# Patient Record
Sex: Male | Born: 1935 | Race: White | Hispanic: No | Smoking: Former smoker
Health system: Southern US, Community
[De-identification: ages and names within clinical notes are randomized; demographics above are authoritative.]

## PROBLEM LIST (undated history)

## (undated) DIAGNOSIS — I359 Nonrheumatic aortic valve disorder, unspecified: Secondary | ICD-10-CM

## (undated) DIAGNOSIS — Z860100 Personal history of colon polyps, unspecified: Secondary | ICD-10-CM

## (undated) DIAGNOSIS — I10 Essential (primary) hypertension: Secondary | ICD-10-CM

## (undated) DIAGNOSIS — I4891 Unspecified atrial fibrillation: Secondary | ICD-10-CM

## (undated) DIAGNOSIS — R413 Other amnesia: Secondary | ICD-10-CM

## (undated) DIAGNOSIS — C801 Malignant (primary) neoplasm, unspecified: Secondary | ICD-10-CM

## (undated) DIAGNOSIS — I6529 Occlusion and stenosis of unspecified carotid artery: Secondary | ICD-10-CM

## (undated) DIAGNOSIS — E119 Type 2 diabetes mellitus without complications: Secondary | ICD-10-CM

## (undated) DIAGNOSIS — G459 Transient cerebral ischemic attack, unspecified: Secondary | ICD-10-CM

## (undated) DIAGNOSIS — I519 Heart disease, unspecified: Secondary | ICD-10-CM

## (undated) DIAGNOSIS — F0390 Unspecified dementia without behavioral disturbance: Secondary | ICD-10-CM

## (undated) DIAGNOSIS — E785 Hyperlipidemia, unspecified: Secondary | ICD-10-CM

## (undated) DIAGNOSIS — Z970 Presence of artificial eye: Secondary | ICD-10-CM

## (undated) HISTORY — PX: CATARACT EXTRACTION: SUR2

---

## 2011-11-20 ENCOUNTER — Ambulatory Visit: Payer: Self-pay | Admitting: Internal Medicine

## 2014-05-28 ENCOUNTER — Inpatient Hospital Stay: Payer: Self-pay | Admitting: Internal Medicine

## 2014-08-09 NOTE — Consult Note (Signed)
PATIENT NAME:  John Werner, John Werner MR#:  [id number] DATE OF BIRTH:  05-01-1921  DATE OF CONSULTATION:  05/28/2014  CONSULTING PHYSICIAN:  Laurene Footman, MD  REASON FOR CONSULTATION:  Left ring finger infection.   HISTORY OF PRESENT ILLNESS: The patient is a 81 year old.  The daughter is concerned that he may have had a cat bite. He suffers from some dementia and is in an assisted care apartment. He had swelling to the finger and his daughter reports it has gotten larger during the day, the 18th, with increased pain and swelling. The patient was examined in the Emergency Room and it was clear he had an abscess. The depth of the abscess was unclear. This was opened in the Emergency Room and there is large amount of purulent material. The following day after IV antibiotics his erythema was lessened in the dorsum of the hand and he is able to move the finger with much less discomfort. Dr. Ola Spurr has been consulted for antibiotic choice.   IMPRESSION:  Abscess to the left ring finger with associated cellulitis.   PLAN:  He seems to be improving.  I will see him in 2 weeks and determine if he requires any hand therapy at that time. His skin should granulate in and heal well as long as the infection does not recur.      ____________________________ Laurene Footman, MD mjm:at D: 05/29/2014 19:06:00 ET T: 05/29/2014 19:35:03 ET JOB#: [id number]  cc: Laurene Footman, MD, <Dictator> Laurene Footman MD ELECTRONICALLY SIGNED 05/30/2014 7:26

## 2014-08-09 NOTE — Op Note (Signed)
PATIENT NAME:  John Werner, John Werner MR#:  [id number] DATE OF BIRTH:  1921-10-12  DATE OF PROCEDURE:  05/28/2014  PREOPERATIVE DIAGNOSIS: Abscess, left ring finger.   POSTOPERATIVE DIAGNOSIS: Abscess, left ring finger.  PROCEDURE: Incision and drainage of left ring finger.   ANESTHESIA: None.   LOCATION: Procedure done in Emergency Room.   DESCRIPTION OF PROCEDURE: After informing the patient and family of the procedure, the skin was noted to be tense, although volar finger from the base of the finger to the tip. Hemostat was used to open up to the skin. A large amount of foul-smelling purulent material came out. The skin was then removed over the volar half of the finger. All the skin had been peeled off from the lower skin secondary to the abscess. At the pulp of the finger, there was a small area less than a centimeter in diameter, which appeared to be the source of the infection. It did not appear to go down to the bone. There was no flexor tendon or other deep structures visualized. The wound was then dressed after culture was obtained.  ESTIMATED BLOOD LOSS: None.   COMPLICATIONS: None.   SPECIMENS: Culture of the finger.   DISPOSITION: The patient was admitted from the Emergency Room.    ____________________________ Laurene Footman, MD mjm:bm D: 05/29/2014 19:05:00 ET T: 05/30/2014 06:07:46 ET JOB#: [id number]  cc: Laurene Footman, MD, <Dictator> Laurene Footman MD ELECTRONICALLY SIGNED 05/30/2014 7:26

## 2014-08-09 NOTE — Discharge Summary (Signed)
PATIENT NAME:  John Werner, John Werner MR#:  [id number] DATE OF BIRTH:  [birth year]  DATE OF ADMISSION:  05/28/2014 DATE OF DISCHARGE:  06/02/2014  REASON FOR ADMISSION: Abscess and cellulitis of the left hand.   HISTORY OF PRESENT ILLNESS: Please see the dictated HPI done by Dr. Darvin Neighbours on 05/28/2014.   PAST MEDICAL HISTORY: 1. Dementia.  2. Type 2 diabetes.  3. Benign hypertension.  4. Peripheral vascular disease with carotid stenosis.  5. B12 deficiency.   MEDICATIONS ON ADMISSION: Please see admission note.   ALLERGIES: No known drug allergies.   SOCIAL HISTORY/FAMILY HISTORY/REVIEW OF SYSTEMS: As per admission note.   PHYSICAL EXAMINATION: GENERAL: The patient was in no acute distress.  VITAL SIGNS: Stable except for a blood pressure of 199/112 with a heart rate of 118.  HEENT: Unremarkable.  NECK: Supple without JVD.  LUNGS: Clear.  CARDIAC: A rapid rate with a regular them. Normal S1, S2.  ABDOMEN: Soft and nontender.  EXTREMITIES: Revealed swelling of the left fourth digit with fluctuance and discoloration.   HOSPITAL COURSE: The patient was admitted with left hand abscess and cellulitis. He was seen in consultation by orthopedics. The area was incised and drained. He was placed on IV antibiotics. He was seen in consultation by infectious disease as well. He was maintained on IV antibiotics. His hand improved dramatically. Blood cultures were negative. He was switched to oral antibiotics and continued to do well. By 06/02/2014, the patient was stable and ready for discharge.   DISCHARGE DIAGNOSES: 1. Left hand abscess/cellulitis status post incision and drainage.  2. Type 2 diabetes.  3. Chronic dementia.  4. Benign hypertension.   DISCHARGE MEDICATIONS: 1. Multivitamin 1 p.o. daily.  2. B12 at 1000 mcg p.o. daily.  3. Janumet XR 1000/50 at 2 p.o. daily.  4. Glipizide 2.5 mg p.o. daily.  5. Cardizem CD 180 mg p.o. daily.  6. Hydrochlorothiazide 25 mg p.o. daily.  7. Levemir  insulin 30 units subcutaneous at bedtime.  8. Aspirin 81 mg p.o. daily.  9. Aricept 10 mg p.o. at bedtime.  10. Klor-Con 20 mEq p.o. b.i.d.  11. Percocet 5/325 mg 1 p.o. q.4 h. p.r.n. pain.  12. Augmentin 875 mg p.o. b.i.d. x10 days.  13. Colace 100 mg p.o. b.i.d.   FOLLOW-UP PLANS AND APPOINTMENTS: The patient was discharged with home health. Dressing changes b.i.d. Return to see me in the office within 1 week's time, sooner if needed.     ____________________________ Leonie Douglas Doy Hutching, MD jds:lm D: 06/10/2014 09:52:52 ET T: 06/10/2014 12:51:21 ET JOB#: [id number]  cc: Leonie Douglas. Doy Hutching, MD, <Dictator> Arabela Basaldua Lennice Sites MD ELECTRONICALLY SIGNED 06/10/2014 20:48

## 2014-08-09 NOTE — Consult Note (Signed)
PATIENT NAME:  John Werner, MADER MR#:  [id number] DATE OF BIRTH:  07-16-1921  DATE OF CONSULTATION:  05/29/2014  REFERRING PHYSICIAN:  Leonie Douglas. Doy Hutching, MD  CONSULTING PHYSICIAN:  Cheral Marker. Ola Spurr, MD  REASON FOR CONSULTATION: Finger infection.   HISTORY OF PRESENT ILLNESS: This is a pleasant 81 year old gentleman with a history of some underlying dementia who was admitted February 18 with left hand finger pain, swelling, and redness. He is quite a poor historian. Most of his history is obtained from his daughter who is at the bedside. Patient does not recall what happened to the finger. His daughter is concerned that it began with a cat bite, as he does have a pet cat he plays with quite frequently. Patient was admitted, had an elevated lactic acid and tachycardia. He underwent bedside debridement by Dr. Rudene Christians on February 18 with abundant purulence. The site was washed out. He is currently on antibiotics. He does have a lot of pain and some redness and swelling at the site, but otherwise denies complaints.   PAST MEDICAL HISTORY: Diabetes, dementia, hypertension, carotid artery stenosis, B12 deficiency.   PAST SURGICAL HISTORY: None.   SOCIAL HISTORY: Does not smoke, has a glass of wine occasionally.   ALLERGIES: No known drug allergies.   FAMILY HISTORY: Noncontributory.  REVIEW OF SYSTEMS: Unable to be obtained.  ANTIBIOTICS SINCE ADMISSION: Include vancomycin and Zosyn.   PHYSICAL EXAMINATION:  VITAL SIGNS: Temperature 97.4, pulse 82, blood pressure 162/94, respirations 18, saturation 95%.  GENERAL: He is pleasant, interactive, but has dementia.  HEENT: Pupils reactive. Sclerae anicteric. Oropharynx clear.  NECK: Supple.  HEART: Regular.  LUNGS: Clear.  ABDOMEN: Soft, nontender, nondistended.  EXTREMITIES: His left ring finger has a bandage on it. This is unwrapped and there is clean skin underneath. It is quite tender. There is erythema spreading dorsally and on the palmar side.    His daughter showed me a picture of his finger prior to debridement. There was quite marked swelling and necrotic tissue on the volar aspect.   DATA: White blood count February 18 was 10.1, hemoglobin 13.9, platelets 130,000. Blood cultures x 2 are no growth to date. Lactic acid was 3.1. Renal function showed a creatinine of 1.2. Wound culture growing colonies that are too small to read. There were many white cells and many gram-negative rods, many gram-positive cocci in chains.   IMPRESSION: A pleasant 81 year old gentleman with dementia admitted with ring finger severe cellulitis and abscess, potentially from a cat bite. He is now status post debridement February 18. Cultures are pending. He is clinically stable and slightly improving on vancomycin and Zosyn.   RECOMMENDATIONS:  1.  Continue vancomycin and Zosyn.  2.  Elevate arm.  3.  Continue dressings.  4.  Further abx based on culture results.   Thanks for the consult. I will be glad to follow with you.    ____________________________ Cheral Marker. Ola Spurr, MD dpf:ST D: 05/29/2014 22:58:58 ET T: 05/29/2014 23:22:41 ET JOB#: [id number]  cc: Cheral Marker. Ola Spurr, MD, <Dictator> Shyloh Derosa Ola Spurr MD ELECTRONICALLY SIGNED 06/11/2014 19:51

## 2014-08-09 NOTE — H&P (Signed)
PATIENT NAME:  John Werner, John Werner MR#:  [id number] DATE OF BIRTH:  [birth year]  DATE OF ADMISSION:  05/28/2014  PRIMARY CARE PHYSICIAN:  Leonie Douglas. Sparks, MD.  CHIEF COMPLAINT: Left-sided finger swelling, pain, redness.    HISTORY OF PRESENT ILLNESS:  A 81 year old Caucasian male patient presents from an independent living facility. The staff noticed that he had swelling of the fourth finger of his Left  hand and sent him to the Emergency Room. This was not noticed yesterday. The patient has dementia, is presently confused and a very poor historian. He does complain of pain with the finger and at times, mentions that there is nothing wrong with his hand.   X-ray was done, which shows just soft tissue swelling. The patient does have a significantly elevated lactic acid at 3.4, tachycardia into the 110s and is being admitted with sepsis to the hospitalist service.    The patient has no other concerns.   REVIEW OF SYSTEMS:  Unobtainable secondary to his dementia.   PAST MEDICAL HISTORY:  1. Diabetes.  2. Dementia.  3. Hypertension.  4. Carotid artery stenosis.  5. B12 deficiency.   SOCIAL HISTORY: The patient does not smoke, has a glass of wine very rarely. Ambulates on his own.   CODE STATUS: DNR/DNI.   ALLERGIES: No known drug allergies.   HOME MEDICATIONS:  1. Aspirin 81 mg daily.  2. Diltiazem 180 mg daily.  3. Donepezil 10 mg daily.  4. Glipizide 2.5 mg daily.  5. Hydrochlorothiazide 25 mg daily.  6. Janumet XR 1050, two tablets oral once a day.  7. Potassium chloride 20 mEq daily.  8. Levemir FlexPen 30 units subcutaneous once a day at bedtime.  9. Multivitamin 1 tablet daily.  10. Vitamin B12 one tablet daily.   FAMILY HISTORY: Reviewed, but unknown.   PHYSICAL EXAMINATION:  VITAL SIGNS: Shows temperature 98.2, pulse of 118, blood pressure 199/112, saturating 96% on room air.  GENERAL: Moderately built, Caucasian male patient lying in bed.  PSYCHIATRIC: Alert, awake, not  oriented to place or time. Presently confused.   HEENT:  Atraumatic, normocephalic. Oral mucosa dry and pink. External ears and nose normal. No pallor. No icterus.  Pupils bilaterally equal and reactive to light.  NECK: Supple. No thyromegaly. No palpable lymph nodes. Trachea midline. No carotid bruit, JVD.  CARDIOVASCULAR: S1, S2, tachycardic, no murmurs. Peripheral pulses 2+. No edema.  RESPIRATORY: Normal work of breathing. Clear to auscultation on both sides.  GASTROINTESTINAL: Soft abdomen, nontender. Bowel sounds present. No hepatosplenomegaly palpable.  SKIN: Warm and dry.   See below. MUSCULOSKELETAL: No joint swelling, redness, effusion of the large joints. Normal muscle tone.  NEUROLOGICAL: Motor strength 5/5 in upper and lower extremities. Sensation to light touch intact all over.  LYMPHATIC: No cervical lymphadenopathy.  EXTREMITIES: His Left fourth finger has significant swelling, fluctuance with discoloration. No discharge found.Marland Kitchen He is able to move the finger, mild erythema along the dorsum of the left hand.   LABORATORY STUDIES: Glucose 251, BUN 17, creatinine 1.20, sodium 139, potassium 3.7. AST, ALT, alkaline phosphatase, bilirubin normal. WBC 10.1, hemoglobin 13.9, platelets of 130,000.  Neutrophils 74 percent. Lactic acid of 3.4.   X-rays normal.   Left hand shows significant soft tissue swelling fourth finger.   ASSESSMENT AND PLAN:  1. Left fourth finger abscess with sepsis. The patient does have a cat at home, possibly this started out due to that. We will start him broadly with vancomycin and Zosyn. Discussed the case with Dr. Rudene Christians  who will be seeing the patient.  He will need an I and D. We will await intraoperative culture results. We will bolus 1 more liter of normal saline in addition to what he got in the Emergency Room.  2. Diabetes mellitus. Continue home medications and sliding scale insulin.  3. Hypertension. Continue home medications.  4. Dementia. The patient  is pleasantly confused. Watch for any inpatient delirium.  5. Deep vein thrombosis prophylaxis with Lovenox.   CODE STATUS: DNR/DNI.   TIME SPENT TODAY ON THIS CASE: 45 minutes.     ____________________________ Leia Alf Erikah Thumm, MD srs:by D: 05/28/2014 17:11:18 ET T: 05/28/2014 17:25:36 ET JOB#: [id number]  cc: Alveta Heimlich R. Omaria Plunk, MD, <Dictator> Leonie Douglas. Doy Hutching, MD Neita Carp MD ELECTRONICALLY SIGNED 06/02/2014 19:11

## 2014-08-09 NOTE — Consult Note (Signed)
Brief Consult Note: Diagnosis: infected left ring finger, abscess opened yesterday.   Patient was seen by consultant.   Comments: foul smelling pus under skin.  Electronic Signatures: Laurene Footman (MD)  (Signed 19-Feb-16 06:40)  Authored: Brief Consult Note   Last Updated: 19-Feb-16 06:40 by Laurene Footman (MD)

## 2015-09-20 ENCOUNTER — Emergency Department: Payer: Medicare HMO

## 2015-09-20 ENCOUNTER — Emergency Department
Admission: EM | Admit: 2015-09-20 | Discharge: 2015-09-20 | Disposition: A | Discharge status: Home / Self Care | Payer: Medicare HMO | Attending: Emergency Medicine | Admitting: Emergency Medicine

## 2015-09-20 ENCOUNTER — Encounter: Payer: Self-pay | Admitting: Emergency Medicine

## 2015-09-20 DIAGNOSIS — E119 Type 2 diabetes mellitus without complications: Secondary | ICD-10-CM | POA: Diagnosis not present

## 2015-09-20 DIAGNOSIS — R1013 Epigastric pain: Secondary | ICD-10-CM | POA: Diagnosis present

## 2015-09-20 DIAGNOSIS — I10 Essential (primary) hypertension: Secondary | ICD-10-CM | POA: Diagnosis not present

## 2015-09-20 DIAGNOSIS — R55 Syncope and collapse: Secondary | ICD-10-CM | POA: Insufficient documentation

## 2015-09-20 HISTORY — DX: Essential (primary) hypertension: I10

## 2015-09-20 HISTORY — DX: Type 2 diabetes mellitus without complications: E11.9

## 2015-09-20 LAB — COMPREHENSIVE METABOLIC PANEL
ALK PHOS: 53 U/L (ref 38–126)
ALT: 45 U/L (ref 17–63)
AST: 53 U/L — AB (ref 15–41)
Albumin: 3.8 g/dL (ref 3.5–5.0)
Anion gap: 11 (ref 5–15)
BILIRUBIN TOTAL: 0.2 mg/dL — AB (ref 0.3–1.2)
BUN: 17 mg/dL (ref 6–20)
CALCIUM: 9.5 mg/dL (ref 8.9–10.3)
CHLORIDE: 103 mmol/L (ref 101–111)
CO2: 26 mmol/L (ref 22–32)
CREATININE: 0.9 mg/dL (ref 0.61–1.24)
Glucose, Bld: 211 mg/dL — ABNORMAL HIGH (ref 65–99)
Potassium: 4.1 mmol/L (ref 3.5–5.1)
Sodium: 140 mmol/L (ref 135–145)
TOTAL PROTEIN: 6.9 g/dL (ref 6.5–8.1)

## 2015-09-20 LAB — CBC WITH DIFFERENTIAL/PLATELET
BASOS PCT: 1 %
Basophils Absolute: 0 10*3/uL (ref 0–0.1)
EOS ABS: 0.2 10*3/uL (ref 0–0.7)
EOS PCT: 3 %
HCT: 41.2 % (ref 40.0–52.0)
Hemoglobin: 13.4 g/dL (ref 13.0–18.0)
LYMPHS ABS: 1.6 10*3/uL (ref 1.0–3.6)
Lymphocytes Relative: 25 %
MCH: 27.7 pg (ref 26.0–34.0)
MCHC: 32.5 g/dL (ref 32.0–36.0)
MCV: 85.1 fL (ref 80.0–100.0)
MONOS PCT: 11 %
Monocytes Absolute: 0.7 10*3/uL (ref 0.2–1.0)
Neutro Abs: 3.9 10*3/uL (ref 1.4–6.5)
Neutrophils Relative %: 60 %
PLATELETS: 110 10*3/uL — AB (ref 150–440)
RBC: 4.84 MIL/uL (ref 4.40–5.90)
RDW: 15.2 % — ABNORMAL HIGH (ref 11.5–14.5)
WBC: 6.3 10*3/uL (ref 3.8–10.6)

## 2015-09-20 LAB — TROPONIN I: Troponin I: <0.03 ng/mL (ref <0.031)

## 2015-09-20 MED ORDER — RANITIDINE HCL 150 MG PO CAPS: 150.0000 mg | ORAL_CAPSULE | Freq: Two times a day (BID) | ORAL | Status: DC

## 2015-09-20 NOTE — ED Notes (Signed)
Remains at [place].  Family at bedside.

## 2015-09-20 NOTE — ED Provider Notes (Signed)
[facility] Emergency Department Provider Note  ____________________________________________  Time seen: 12:35 PM  I have reviewed the triage vital signs and the nursing notes.   HISTORY  Chief Complaint Emesis  Level 5 caveat:  Portions of the history and physical were unable to be obtained due to the patient's being a poor historian HPI John Werner is a 82 y.o. male who is eating lunch at his assisted living facility today when he had apparently a syncopal event. Unclear if he had any vomiting before or after. Patient doesn't have any recall of the event. He complains of some epigastric pain but no chest pain or shortness of breath. The patient is spitting out saliva since arrival but denies foreign body sensation. No exertional symptoms. Pain is nonradiating. Feeling better.     Past Medical History  Diagnosis Date  . Diabetes mellitus without complication ([place])   . Hypertension      There are no active problems to display for this patient.    History reviewed. No pertinent past surgical history.   No current outpatient prescriptions on file.   Allergies Review of patient's allergies indicates no known allergies.   No family history on file.  Social History Social History  Substance Use Topics  . Smoking status: Never Smoker   . Smokeless tobacco: None  . Alcohol Use: No    Review of Systems  Constitutional:   No fever or chills.  Eyes:   No vision changes.  ENT:   No sore throat. No rhinorrhea. Cardiovascular:   No chest pain. Respiratory:   No dyspnea or cough. Gastrointestinal:   Positive epigastric pain, possible episode of vomiting. No diarrhea. Genitourinary:   Negative for dysuria or difficulty urinating. Musculoskeletal:   Negative for focal pain or swelling Neurological:   Negative for headaches 10-point ROS otherwise negative.  ____________________________________________   PHYSICAL EXAM:  VITAL SIGNS: ED  Triage Vitals  Enc Vitals Group     BP 09/20/15 1244 141/74 mmHg     Pulse Rate 09/20/15 1244 74     Resp 09/20/15 1244 18     Temp 09/20/15 1247 98.1 F (36.7 C)     Temp Source 09/20/15 1247 Oral     SpO2 09/20/15 1244 97 %     Weight 09/20/15 1244 180 lb (81.647 kg)     Height 09/20/15 1244 5\' 7"  (1.702 m)     Head Cir --      Peak Flow --      Pain Score 09/20/15 1246 0     Pain Loc --      Pain Edu? --      Excl. in [place]? --     Vital signs reviewed, nursing assessments reviewed.   Constitutional:   Alert and oriented. Well appearing and in no distress. Eyes:   No scleral icterus. No conjunctival pallor. PERRL. EOMI.  No nystagmus. ENT   Head:   Normocephalic and atraumatic.   Nose:   No congestion/rhinnorhea. No septal hematoma   Mouth/Throat:   MMM, no pharyngeal erythema. No peritonsillar mass.    Neck:   No stridor. No SubQ emphysema. No meningismus. Hematological/Lymphatic/Immunilogical:   No cervical lymphadenopathy. Cardiovascular:   RRR. Symmetric bilateral radial and DP pulses.  No murmurs.  Respiratory:   Normal respiratory effort without tachypnea nor retractions. Breath sounds are clear and equal bilaterally. No wheezes/rales/rhonchi. Gastrointestinal:   Soft with epigastric and right upper quadrant tenderness. Non distended. There is no CVA tenderness.  No  rebound, rigidity, or guarding. Genitourinary:   deferred Musculoskeletal:   Nontender with normal range of motion in all extremities. No joint effusions.  No lower extremity tenderness.  No edema. Neurologic:   Normal speech and language.  CN 2-10 normal. Motor grossly intact. No gross focal neurologic deficits are appreciated.  Skin:    Skin is warm, dry and intact. No rash noted.  No petechiae, purpura, or bullae.  ____________________________________________    LABS (pertinent positives/negatives) (all labs ordered are listed, but only abnormal results are displayed) Labs Reviewed   COMPREHENSIVE METABOLIC PANEL - Abnormal; Notable for the following:    Glucose, Bld 211 (*)    AST 53 (*)    Total Bilirubin 0.2 (*)    All other components within normal limits  CBC WITH DIFFERENTIAL/PLATELET - Abnormal; Notable for the following:    RDW 15.2 (*)    Platelets 110 (*)    All other components within normal limits  TROPONIN I  TROPONIN I   ____________________________________________   EKG  Interpreted by me Normal sinus rhythm rate of 74, left axis, normal intervals. Normal QRS ST segments and T waves.  ____________________________________________    RADIOLOGY  Chest x-ray unremarkable Ultrasound abdomen  ____________________________________________   PROCEDURES   ____________________________________________   INITIAL IMPRESSION / ASSESSMENT AND PLAN / ED COURSE  Pertinent labs & imaging results that were available during my care of the patient were reviewed by me and considered in my medical decision making (see chart for details).  Patient presents with epigastric pain after an episode of syncope and possible vomiting during lunch. Low suspicion for ruptured AAA, but we'll get an ultrasound of abdomen to evaluate aorta and gallbladder, check troponin 2.. Patient is able to tolerate oral intake with crackers and soda so I don't think he is having a esophageal foreign body or obstruction. Low suspicion for perforation.Considering the patient's symptoms, medical history, and physical examination today, I have low suspicion for ACS, PE, TAD, pneumothorax, carditis, mediastinitis, pneumonia, CHF, or sepsis.    ----------------------------------------- 3:08 PM on 09/20/2015 ----------------------------------------- Ultrasound pending. On reassessment at about 2:00 PM the patient is feeling better and wishes to go home. Care of the patient was signed out to Dr. Jacqualine Code pending second troponin and ultrasound result. If there are no severe abnormal as the  patient is stable for discharge home and follow-up with his primary care doctor.        ____________________________________________   FINAL CLINICAL IMPRESSION(S) / ED DIAGNOSES  Final diagnoses:  Syncope, unspecified syncope type       Portions of this note were generated with dragon dictation software. Dictation errors may occur despite best attempts at proofreading.   Carrie Mew, MD 09/20/15 [phone]

## 2015-09-20 NOTE — Discharge Instructions (Signed)
Abdominal Pain, Adult Many things can cause abdominal pain. Usually, abdominal pain is not caused by a disease and will improve without treatment. It can often be observed and treated at home. Your health care provider will do a physical exam and possibly order blood tests and X-rays to help determine the seriousness of your pain. However, in many cases, more time must pass before a clear cause of the pain can be found. Before that point, your health care provider may not know if you need more testing or further treatment. HOME CARE INSTRUCTIONS Monitor your abdominal pain for any changes. The following actions may help to alleviate any discomfort you are experiencing:  Only take over-the-counter or prescription medicines as directed by your health care provider.  Do not take laxatives unless directed to do so by your health care provider.  Try a clear liquid diet (broth, tea, or water) as directed by your health care provider. Slowly move to a bland diet as tolerated. SEEK MEDICAL CARE IF:  You have unexplained abdominal pain.  You have abdominal pain associated with nausea or diarrhea.  You have pain when you urinate or have a bowel movement.  You experience abdominal pain that wakes you in the night.  You have abdominal pain that is worsened or improved by eating food.  You have abdominal pain that is worsened with eating fatty foods.  You have a fever. SEEK IMMEDIATE MEDICAL CARE IF:  Your pain does not go away within 2 hours.  You keep throwing up (vomiting).  Your pain is felt only in portions of the abdomen, such as the right side or the left lower portion of the abdomen.  You pass bloody or black tarry stools. MAKE SURE YOU:  Understand these instructions.  Will watch your condition.  Will get help right away if you are not doing well or get worse.   This information is not intended to replace advice given to you by your health care provider. Make sure you discuss  any questions you have with your health care provider.   Document Released: 01/04/2005 Document Revised: 12/16/2014 Document Reviewed: 12/04/2012 Elsevier Interactive Patient Education 2016 [employer].  Syncope Syncope is a medical term for fainting or passing out. This means you lose consciousness and drop to the ground. People are generally unconscious for less than 5 minutes. You may have some muscle twitches for up to 15 seconds before waking up and returning to normal. Syncope occurs more often in older adults, but it can happen to anyone. While most causes of syncope are not dangerous, syncope can be a sign of a serious medical problem. It is important to seek medical care.  CAUSES  Syncope is caused by a sudden drop in blood flow to the brain. The specific cause is often not determined. Factors that can bring on syncope include:  Taking medicines that lower blood pressure.  Sudden changes in posture, such as standing up quickly.  Taking more medicine than prescribed.  Standing in one place for too long.  Seizure disorders.  Dehydration and excessive exposure to heat.  Low blood sugar (hypoglycemia).  Straining to have a bowel movement.  Heart disease, irregular heartbeat, or other circulatory problems.  Fear, emotional distress, seeing blood, or severe pain. SYMPTOMS  Right before fainting, you may:  Feel dizzy or light-headed.  Feel nauseous.  See all white or all black in your field of vision.  Have cold, clammy skin. DIAGNOSIS  Your health care provider will ask about  your symptoms, perform a physical exam, and perform an electrocardiogram (ECG) to record the electrical activity of your heart. Your health care provider may also perform other heart or blood tests to determine the cause of your syncope which may include:  Transthoracic echocardiogram (TTE). During echocardiography, sound waves are used to evaluate how blood flows through your  heart.  Transesophageal echocardiogram (TEE).  Cardiac monitoring. This allows your health care provider to monitor your heart rate and rhythm in real time.  Holter monitor. This is a portable device that records your heartbeat and can help diagnose heart arrhythmias. It allows your health care provider to track your heart activity for several days, if needed.  Stress tests by exercise or by giving medicine that makes the heart beat faster. TREATMENT  In most cases, no treatment is needed. Depending on the cause of your syncope, your health care provider may recommend changing or stopping some of your medicines. HOME CARE INSTRUCTIONS  Have someone stay with you until you feel stable.  Do not drive, use machinery, or play sports until your health care provider says it is okay.  Keep all follow-up appointments as directed by your health care provider.  Lie down right away if you start feeling like you might faint. Breathe deeply and steadily. Wait until all the symptoms have passed.  Drink enough fluids to keep your urine clear or pale yellow.  If you are taking blood pressure or heart medicine, get up slowly and take several minutes to sit and then stand. This can reduce dizziness. SEEK IMMEDIATE MEDICAL CARE IF:   You have a severe headache.  You have unusual pain in the chest, abdomen, or back.  You are bleeding from your mouth or rectum, or you have black or tarry stool.  You have an irregular or very fast heartbeat.  You have pain with breathing.  You have repeated fainting or seizure-like jerking during an episode.  You faint when sitting or lying down.  You have confusion.  You have trouble walking.  You have severe weakness.  You have vision problems. If you fainted, call your local emergency services (911 in [place] Do not drive yourself to the hospital.    This information is not intended to replace advice given to you by your health care provider. Make sure  you discuss any questions you have with your health care provider.   Document Released: 03/27/2005 Document Revised: 08/11/2014 Document Reviewed: 05/26/2011 Elsevier Interactive Patient Education [employer].

## 2015-09-20 NOTE — ED Provider Notes (Signed)
Patient reports asymptomatic. Ultrasound reassuring. He's been able to eat crackers and water and states no pain or discomfort. Second troponin negative.  Return precautions and treatment recommendations and follow-up discussed with the patient who is agreeable with the plan.   Delman Kitten, MD 09/20/15 [phone]

## 2015-09-20 NOTE — ED Notes (Signed)
Patient returns from CXR.  Diet Ginger Ale and Graham Cracker given to patient. Patient tolerated PO well. No difficulty.  Continue to monitor.

## 2015-09-20 NOTE — ED Notes (Signed)
EMS arrives with patient with c/o vomiting and possible syncope while eating lunch today.  Patient is AAOx3.  Skin warm and dry.  Denies current c/o pain.  Only C/O is excessive saliva.  Patient is a resident at [facility].

## 2016-04-13 ENCOUNTER — Encounter: Payer: Self-pay | Admitting: *Deleted

## 2016-04-13 ENCOUNTER — Emergency Department
Admission: EM | Admit: 2016-04-13 | Discharge: 2016-04-13 | Disposition: A | Discharge status: Home / Self Care | Payer: Medicare HMO | Attending: Emergency Medicine | Admitting: Emergency Medicine

## 2016-04-13 DIAGNOSIS — E1165 Type 2 diabetes mellitus with hyperglycemia: Secondary | ICD-10-CM | POA: Insufficient documentation

## 2016-04-13 DIAGNOSIS — I10 Essential (primary) hypertension: Secondary | ICD-10-CM | POA: Diagnosis not present

## 2016-04-13 DIAGNOSIS — R739 Hyperglycemia, unspecified: Secondary | ICD-10-CM

## 2016-04-13 LAB — URINALYSIS, COMPLETE (UACMP) WITH MICROSCOPIC
BACTERIA UA: NONE SEEN
Bilirubin Urine: NEGATIVE
Hgb urine dipstick: NEGATIVE
KETONES UR: 5 mg/dL — AB
NITRITE: NEGATIVE
PH: 5 (ref 5.0–8.0)
PROTEIN: 30 mg/dL — AB
Specific Gravity, Urine: 1.031 — ABNORMAL HIGH (ref 1.005–1.030)

## 2016-04-13 LAB — TROPONIN I

## 2016-04-13 LAB — BASIC METABOLIC PANEL
Anion gap: 8 (ref 5–15)
BUN: 29 mg/dL — AB (ref 6–20)
CALCIUM: 9.5 mg/dL (ref 8.9–10.3)
CO2: 28 mmol/L (ref 22–32)
Chloride: 101 mmol/L (ref 101–111)
Creatinine, Ser: 0.78 mg/dL (ref 0.61–1.24)
GFR calc Af Amer: >60 mL/min (ref >60)
GLUCOSE: 341 mg/dL — AB (ref 65–99)
Potassium: 4.6 mmol/L (ref 3.5–5.1)
Sodium: 137 mmol/L (ref 135–145)

## 2016-04-13 LAB — CBC
HCT: 40.3 % (ref 40.0–52.0)
Hemoglobin: 13.2 g/dL (ref 13.0–18.0)
MCH: 28 pg (ref 26.0–34.0)
MCHC: 32.7 g/dL (ref 32.0–36.0)
MCV: 85.6 fL (ref 80.0–100.0)
Platelets: 113 10*3/uL — ABNORMAL LOW (ref 150–440)
RBC: 4.71 MIL/uL (ref 4.40–5.90)
RDW: 15 % — ABNORMAL HIGH (ref 11.5–14.5)
WBC: 5.9 10*3/uL (ref 3.8–10.6)

## 2016-04-13 LAB — GLUCOSE, CAPILLARY: Glucose-Capillary: 302 mg/dL — ABNORMAL HIGH (ref 65–99)

## 2016-04-13 NOTE — ED Provider Notes (Signed)
[facility] Emergency Department Provider Note ____________________________________________   I have reviewed the triage vital signs and the triage nursing note.  HISTORY  Chief Complaint Hyperglycemia and Fatigue   Historian Patient  HPI John Werner is a 83 y.o. male with a history of diabetes who takes long-acting insulin as well as 2 by mouth medications, presents with his daughter because of elevated blood sugars. He lives alone and has mild dementia, and she has checked his sugars and a few times after meals and has been in the 400s, but today she took his blood sugar before lunch and it was in the 400s. When she called the endocrinologist Dr. Johnell Comings office she was told to bring him in for further evaluation.  He has some dementia and has been confused at times, but no focal weakness or numbness.  No fever. No recent cough congestion or shortness of breath or trouble breathing. No chest pain. Abdominal pain, nausea, or diarrhea.    Past Medical History:  Diagnosis Date  . Diabetes mellitus without complication ([place])   . Hypertension     There are no active problems to display for this patient.   History reviewed. No pertinent surgical history.  Prior to Admission medications   Medication Sig Start Date End Date Taking? Authorizing Provider  ranitidine (ZANTAC) 150 MG capsule Take 1 capsule (150 mg total) by mouth 2 (two) times daily. 09/20/15   Carrie Mew, MD    No Known Allergies  No family history on file.  Social History Social History  Substance Use Topics  . Smoking status: Never Smoker  . Smokeless tobacco: Not on file  . Alcohol use No    Review of Systems  Constitutional: Negative for fever. Eyes: Negative for visual changes. ENT: Negative for sore throat. Cardiovascular: Negative for chest pain. Respiratory: Negative for shortness of breath. Gastrointestinal: Negative for abdominal pain, vomiting and  diarrhea. Genitourinary: Negative for dysuria. Musculoskeletal: Negative for back pain. Skin: Negative for rash. Neurological: Negative for headache. 10 point Review of Systems otherwise negative ____________________________________________   PHYSICAL EXAM:  VITAL SIGNS: ED Triage Vitals  Enc Vitals Group     BP 04/13/16 1328 118/68     Pulse Rate 04/13/16 1328 61     Resp 04/13/16 1328 20     Temp 04/13/16 1328 98.2 F (36.8 C)     Temp Source 04/13/16 1328 Oral     SpO2 04/13/16 1328 98 %     Weight 04/13/16 1329 142 lb (64.4 kg)     Height 04/13/16 1329 5\' 6"  (1.676 m)     Head Circumference --      Peak Flow --      Pain Score --      Pain Loc --      Pain Edu? --      Excl. in [place]? --      Constitutional: Alert and Cooperative. Well appearing and in no distress. HEENT   Head: Normocephalic and atraumatic.      Eyes: Conjunctivae are normal. PERRL. Normal extraocular movements.      Ears:         Nose: No congestion/rhinnorhea.   Mouth/Throat: Mucous membranes are moist.   Neck: No stridor. Cardiovascular/Chest: Normal rate, regular rhythm.  No murmurs, rubs, or gallops. Respiratory: Normal respiratory effort without tachypnea nor retractions. Breath sounds are clear and equal bilaterally. No wheezes/rales/rhonchi. Gastrointestinal: Soft. No distention, no guarding, no rebound. Nontender.    Genitourinary/rectal:Deferred Musculoskeletal: Nontender with normal  range of motion in all extremities. No joint effusions.  No lower extremity tenderness.  No edema. Neurologic:  Normal speech and language. No gross or focal neurologic deficits are appreciated. Skin:  Skin is warm, dry and intact. No rash noted. Psychiatric: Mood and affect are normal. Speech and behavior are normal. Patient exhibits appropriate insight and judgment.   ____________________________________________  LABS (pertinent positives/negatives)  Labs Reviewed  BASIC METABOLIC PANEL -  Abnormal; Notable for the following:       Result Value   Glucose, Bld 341 (*)    BUN 29 (*)    All other components within normal limits  CBC - Abnormal; Notable for the following:    RDW 15.0 (*)    Platelets 113 (*)    All other components within normal limits  URINALYSIS, COMPLETE (UACMP) WITH MICROSCOPIC - Abnormal; Notable for the following:    Color, Urine AMBER (*)    APPearance HAZY (*)    Specific Gravity, Urine 1.031 (*)    Glucose, UA >=500 (*)    Ketones, ur 5 (*)    Protein, ur 30 (*)    Leukocytes, UA SMALL (*)    Squamous Epithelial / LPF 0-5 (*)    All other components within normal limits  GLUCOSE, CAPILLARY - Abnormal; Notable for the following:    Glucose-Capillary 302 (*)    All other components within normal limits  TROPONIN I  CBG MONITORING, ED    ____________________________________________    EKG I, Lisa Roca, MD, the attending physician have personally viewed and interpreted all ECGs.  62 bpm. Normal sinus rhythm. Left axis deviation. Nonspecific ST and T-wave ____________________________________________  RADIOLOGY All Xrays were viewed by me. Imaging interpreted by Radiologist.  None __________________________________________  PROCEDURES  Procedure(s) performed: None  Critical Care performed: None  ____________________________________________   ED COURSE / ASSESSMENT AND PLAN  Pertinent labs & imaging results that were available during my care of the patient were reviewed by me and considered in my medical decision making (see chart for details).    John Werner is here with his daughter for evaluation of essentially asymptomatic hyperglycemia. No clear acute cause for hyperglycemia, no signs or symptoms of stroke on exam or by history. No signs or symptoms for infections, nor cardiopulmonary emergency.  No evidence for diabetic ketoacidosis.  I discussed with daughter that it may be possible that this has actually been  somewhat gradual in that today she randomly checked before a meal, where she has checked several times after her meal and the blood sugar was very high and perhaps this is more chronic elevation. In any case, I don't really want to risk hypoglycemia. I am asking her to go ahead and take before meal measurements as stated for Dr. Gabriel Carina, her endocrinologist. We also discussed return precautions.    CONSULTATIONS:   None   Patient / Family / Caregiver informed of clinical course, medical decision-making process, and agree with plan.   I discussed return precautions, follow-up instructions, and discharge instructions with patient and/or family.   ___________________________________________   FINAL CLINICAL IMPRESSION(S) / ED DIAGNOSES   Final diagnoses:  Hyperglycemia              Note: This dictation was prepared with Dragon dictation. Any transcriptional errors that result from this process are unintentional    Lisa Roca, MD 04/13/16 1742

## 2016-04-13 NOTE — ED Triage Notes (Addendum)
Daughter reports hyperglycemia today today 443 fasting, pt is more confused today from baseline, pt seen by Dr Doy Hutching yesterday for low back pain, daughter reports negative UA, pt is alert, pleasantly confused, daughter reports history of dementia, blood sugar 302 in triage

## 2016-04-13 NOTE — Discharge Instructions (Signed)
You were evaluated for elevated blood sugar, and although no certain cause was found, your exam and evaluation are reassuring in the emergency department today.  Return to the emergency department for any glucose greater than 500, any symptoms of new or worsening confusion, weakness, numbness, fever, chest pain, trouble breathing, vomiting, or any other symptoms concerning to you.

## 2016-06-07 ENCOUNTER — Emergency Department: Payer: Medicare HMO

## 2016-06-07 ENCOUNTER — Emergency Department
Admission: EM | Admit: 2016-06-07 | Discharge: 2016-06-07 | Disposition: A | Discharge status: Home / Self Care | Payer: Medicare HMO | Attending: Emergency Medicine | Admitting: Emergency Medicine

## 2016-06-07 DIAGNOSIS — M4854XA Collapsed vertebra, not elsewhere classified, thoracic region, initial encounter for fracture: Secondary | ICD-10-CM | POA: Insufficient documentation

## 2016-06-07 DIAGNOSIS — E119 Type 2 diabetes mellitus without complications: Secondary | ICD-10-CM | POA: Diagnosis not present

## 2016-06-07 DIAGNOSIS — K560 Paralytic ileus: Secondary | ICD-10-CM | POA: Diagnosis not present

## 2016-06-07 DIAGNOSIS — M549 Dorsalgia, unspecified: Secondary | ICD-10-CM | POA: Diagnosis present

## 2016-06-07 DIAGNOSIS — S32030A Wedge compression fracture of third lumbar vertebra, initial encounter for closed fracture: Secondary | ICD-10-CM

## 2016-06-07 DIAGNOSIS — J984 Other disorders of lung: Secondary | ICD-10-CM | POA: Diagnosis not present

## 2016-06-07 DIAGNOSIS — I10 Essential (primary) hypertension: Secondary | ICD-10-CM | POA: Diagnosis not present

## 2016-06-07 DIAGNOSIS — M4856XA Collapsed vertebra, not elsewhere classified, lumbar region, initial encounter for fracture: Secondary | ICD-10-CM | POA: Insufficient documentation

## 2016-06-07 DIAGNOSIS — S22080A Wedge compression fracture of T11-T12 vertebra, initial encounter for closed fracture: Secondary | ICD-10-CM

## 2016-06-07 DIAGNOSIS — Z79899 Other long term (current) drug therapy: Secondary | ICD-10-CM | POA: Diagnosis not present

## 2016-06-07 LAB — CBC WITH DIFFERENTIAL/PLATELET
BASOS ABS: 0 10*3/uL (ref 0–0.1)
Basophils Relative: 0 %
Eosinophils Absolute: 0 10*3/uL (ref 0–0.7)
Eosinophils Relative: 0 %
HEMATOCRIT: 40.2 % (ref 40.0–52.0)
HEMOGLOBIN: 13.6 g/dL (ref 13.0–18.0)
LYMPHS ABS: 0.7 10*3/uL — AB (ref 1.0–3.6)
LYMPHS PCT: 9 %
MCH: 28.2 pg (ref 26.0–34.0)
MCHC: 33.8 g/dL (ref 32.0–36.0)
MCV: 83.2 fL (ref 80.0–100.0)
Monocytes Absolute: 0.7 10*3/uL (ref 0.2–1.0)
Monocytes Relative: 9 %
NEUTROS ABS: 7.2 10*3/uL — AB (ref 1.4–6.5)
NEUTROS PCT: 82 %
Platelets: 86 10*3/uL — ABNORMAL LOW (ref 150–440)
RBC: 4.83 MIL/uL (ref 4.40–5.90)
RDW: 15.9 % — ABNORMAL HIGH (ref 11.5–14.5)
WBC: 8.7 10*3/uL (ref 3.8–10.6)

## 2016-06-07 LAB — COMPREHENSIVE METABOLIC PANEL
ALK PHOS: 103 U/L (ref 38–126)
ALT: 26 U/L (ref 17–63)
AST: 37 U/L (ref 15–41)
Albumin: 3.8 g/dL (ref 3.5–5.0)
Anion gap: 10 (ref 5–15)
BUN: 14 mg/dL (ref 6–20)
CALCIUM: 9.2 mg/dL (ref 8.9–10.3)
CO2: 27 mmol/L (ref 22–32)
CREATININE: 0.72 mg/dL (ref 0.61–1.24)
Chloride: 103 mmol/L (ref 101–111)
Glucose, Bld: 253 mg/dL — ABNORMAL HIGH (ref 65–99)
Potassium: 4.1 mmol/L (ref 3.5–5.1)
Sodium: 140 mmol/L (ref 135–145)
Total Bilirubin: 1.2 mg/dL (ref 0.3–1.2)
Total Protein: 7.5 g/dL (ref 6.5–8.1)

## 2016-06-07 LAB — URINALYSIS, COMPLETE (UACMP) WITH MICROSCOPIC
BACTERIA UA: NONE SEEN
Bilirubin Urine: NEGATIVE
Glucose, UA: >=500 mg/dL — AB
HGB URINE DIPSTICK: NEGATIVE
Ketones, ur: 20 mg/dL — AB
Leukocytes, UA: NEGATIVE
Nitrite: NEGATIVE
Protein, ur: 30 mg/dL — AB
RBC / HPF: NONE SEEN RBC/hpf (ref 0–5)
SPECIFIC GRAVITY, URINE: 1.017 (ref 1.005–1.030)
SQUAMOUS EPITHELIAL / LPF: NONE SEEN
pH: 5 (ref 5.0–8.0)

## 2016-06-07 LAB — LIPASE, BLOOD: Lipase: 13 U/L (ref 11–51)

## 2016-06-07 LAB — TROPONIN I

## 2016-06-07 MED ORDER — IBUPROFEN 600 MG PO TABS
600.0000 mg | ORAL_TABLET | Freq: Once | ORAL | Status: AC
  Administered 2016-06-07: 600 mg via ORAL
  Filled 2016-06-07: qty 1

## 2016-06-07 MED ORDER — BACITRACIN ZINC 500 UNIT/GM EX OINT
TOPICAL_OINTMENT | CUTANEOUS | Status: AC
  Filled 2016-06-07: qty 0.9

## 2016-06-07 MED ORDER — IOPAMIDOL (ISOVUE-370) INJECTION 76%
100.0000 mL | Freq: Once | INTRAVENOUS | Status: AC | PRN
  Administered 2016-06-07: 100 mL via INTRAVENOUS

## 2016-06-07 MED ORDER — BACITRACIN ZINC 500 UNIT/GM EX OINT
TOPICAL_OINTMENT | Freq: Once | CUTANEOUS | Status: AC
  Administered 2016-06-07: 18:00:00 via TOPICAL

## 2016-06-07 NOTE — ED Notes (Signed)
Pt c/o worsening back pain xfew days, hard to ambulate or sit. Pt NAD at this time, denies any GU symptoms

## 2016-06-07 NOTE — ED Provider Notes (Addendum)
[facility] Emergency Department Provider Note ____________________________________________   I have reviewed the triage vital signs and the triage nursing note.  HISTORY  Chief Complaint Back Pain   Historian Patient  HPI John Werner is a 83 y.o. male with history of diabetes and hypertension, lives independently but his daughter checks on him frequently, presents due to worsening low back pain. Initially they thought the back pain was possibly due to urinary tract infection a few months back. He was treated for possible musculoskeletal back pain with a muscle relaxer at the beginning of the care and they're not sure it helped the pain, and it causes him some altered mental status so they stopped that. Over the past 1 week however the pain has been much worse. Patient is having trouble standing up from a chair and walking due to pain in his mid and low back. No extension down his leg. Today daughter states that she thinks his belly is a little bit swollen. He does not particularly endorse abdominal pain or constipation, but I do think his belly is swollen. No chest pain or trouble breathing. No vomiting.      Past Medical History:  Diagnosis Date  . Diabetes mellitus without complication ([place])   . Hypertension     There are no active problems to display for this patient.   History reviewed. No pertinent surgical history.  Prior to Admission medications   Medication Sig Start Date End Date Taking? Authorizing Provider  ranitidine (ZANTAC) 150 MG capsule Take 1 capsule (150 mg total) by mouth 2 (two) times daily. 09/20/15   Carrie Mew, MD    No Known Allergies  History reviewed. No pertinent family history.  Social History Social History  Substance Use Topics  . Smoking status: Never Smoker  . Smokeless tobacco: Not on file  . Alcohol use No    Review of Systems  Constitutional: Negative for fever. Eyes: Negative for visual  changes. ENT: Negative for sore throat. Cardiovascular: Negative for chest pain. Respiratory: Negative for shortness of breath. Gastrointestinal: Negative for abdominal pain, vomiting and diarrhea. Genitourinary: Negative for dysuria. Musculoskeletal: Positive for back pain. Skin: Negative for rash. Neurological: Negative for headache. 10 point Review of Systems otherwise negative ____________________________________________   PHYSICAL EXAM:  VITAL SIGNS: ED Triage Vitals  Enc Vitals Group     BP 06/07/16 1301 124/73     Pulse Rate 06/07/16 1301 72     Resp 06/07/16 1301 20     Temp 06/07/16 1301 98.4 F (36.9 C)     Temp Source 06/07/16 1301 Oral     SpO2 06/07/16 1301 94 %     Weight 06/07/16 1302 145 lb (65.8 kg)     Height 06/07/16 1302 5\' 6"  (1.676 m)     Head Circumference --      Peak Flow --      Pain Score 06/07/16 1302 6     Pain Loc --      Pain Edu? --      Excl. in [place]? --      Constitutional: Alert and Cooperative, slight disorientation and hard of hearing. Well appearing and in no distress. HEENT   Head: Normocephalic and atraumatic.      Eyes: Conjunctivae are normal. PERRL. Normal extraocular movements.      Ears:         Nose: No congestion/rhinnorhea.   Mouth/Throat: Mucous membranes are moist.   Neck: No stridor. Cardiovascular/Chest: Normal rate, regular rhythm.  No murmurs, rubs, or gallops. Respiratory: Normal respiratory effort without tachypnea nor retractions. Breath sounds are clear and equal bilaterally. No wheezes/rales/rhonchi. Gastrointestinal: Soft. Mild distention? No guarding, no rebound. Nontender.  No palpable mass.  Genitourinary/rectal:Deferred Musculoskeletal: Nontender with normal range of motion in all extremities. No joint effusions.  No lower extremity tenderness.  No edema. Neurologic:  Normal speech and language. No gross or focal neurologic deficits are appreciated. Skin:  Skin is warm, dry and intact. No rash  noted.  He has 2 areas of erythema on his left back, one has a blister with roof. No cellulitis. Looks like a burn. Psychiatric: Mood and affect are normal. Speech and behavior are normal. Patient exhibits appropriate insight and judgment.   ____________________________________________  LABS (pertinent positives/negatives)  Labs Reviewed  URINALYSIS, COMPLETE (UACMP) WITH MICROSCOPIC - Abnormal; Notable for the following:       Result Value   Color, Urine YELLOW (*)    APPearance CLEAR (*)    Glucose, UA >=500 (*)    Ketones, ur 20 (*)    Protein, ur 30 (*)    All other components within normal limits  CBC WITH DIFFERENTIAL/PLATELET - Abnormal; Notable for the following:    RDW 15.9 (*)    Platelets 86 (*)    Neutro Abs 7.2 (*)    Lymphs Abs 0.7 (*)    All other components within normal limits  COMPREHENSIVE METABOLIC PANEL - Abnormal; Notable for the following:    Glucose, Bld 253 (*)    All other components within normal limits  TROPONIN I  LIPASE, BLOOD    ____________________________________________    EKG I, Lisa Roca, MD, the attending physician have personally viewed and interpreted all ECGs.  None ____________________________________________  RADIOLOGY All Xrays were viewed by me. Imaging interpreted by Radiologist.  Thoracic x-ray spine:IMPRESSION: 1.  Mild T12 compression fracture.  2.  Thoracic spine scoliosis and diffuse degenerative change.  Chest x-ray/abdomen:  IMPRESSION: 1. Air-filled loops of small large bowel noted. Mild adynamic ileus cannot be excluded.  2.  L3 compression fracture, age undetermined.  CT chest and pelvis angiogram with contrast: IMPRESSION: Aortic atherosclerosis without significant aneurysm, dissection, or acute vascular process.  No significant acute pulmonary embolus by CTA.  Mild proximal descending thoracic aorta ectasia, maximal diameter 4 cm.  Native coronary atherosclerosis  Abdominal  atherosclerosis as above without acute process  Acute T12 and L3 compression fractures as detailed above. This likely accounts for the patient's persistent back pain.  No other acute intrathoracic, abdominal or pelvic finding. __________________________________________  PROCEDURES  Procedure(s) performed: None  Critical Care performed: None  ____________________________________________   ED COURSE / ASSESSMENT AND PLAN  Pertinent labs & imaging results that were available during my care of the patient were reviewed by me and considered in my medical decision making (see chart for details).   John Werner is here for low back pain. X-rays showed 2 compression fractures, new scents June 2017 which could be the source of his pain. Laboratory studies UA are reassuring.  Patient apparently had a chest x-ray and a lumbar spine x-ray in January, through the care of her sister, I can see that these were ordered, but held the actual report or images myself. There was one note from primary care doctor Dr. Doy Hutching in a telephone note stating that chest x-ray was okay. So is possible that these fractures are new since January.  I did discuss obtaining CT scan for further evaluation, and we chose to proceed.  CT scan does show the acute fractures T12 and L3. I discussed with with patient and the daughter.  Patient was able to get up and walk the halls. I think he is okay for outpatient discharge. Daughter had been concerned about possibly interest in nursing home from the standpoint of mental decline, but for now I think he is okay for discharge home and they can work on this as an outpatient for change of level of care.  Blister popped on his back, ointment and dressing placed by the nurse. I suspect this is a from the heating pad that he was sitting on yesterday for the back pain.  CONSULTATIONS: None  Patient / Family / Caregiver informed of clinical course, medical decision-making process,  and agree with plan.   I discussed return precautions, follow-up instructions, and discharge instructions with patient and/or family.   ___________________________________________   FINAL CLINICAL IMPRESSION(S) / ED DIAGNOSES   Final diagnoses:  T12 compression fracture ([place])  Closed compression fracture of L3 lumbar vertebra, initial encounter [facility])              Note: This dictation was prepared with Dragon dictation. Any transcriptional errors that result from this process are unintentional    Lisa Roca, MD 06/07/16 [place], MD 06/07/16 [phone]

## 2016-06-07 NOTE — ED Notes (Signed)
Patient transported to X-ray 

## 2016-06-07 NOTE — ED Triage Notes (Signed)
Pt has had back pain x few months, been treated with muscle relaxers per Dr. Doy Hutching. Muscle relaxer was causing stroke like symptoms which he has been seen here for that. Pain is causing problems with sitting,standing. Back has red areas, possible bed sores per daughter. Pt in wheelchair. Denies urinary symptoms. Pt appears confused about where he is. Negative back xray per daughter in December.

## 2016-06-07 NOTE — Discharge Instructions (Signed)
You were evaluated for back pain and found to have 2 compression spine fractures, T12 and L3. You may take over-the-counter ibuprofen 600 mg every 8 hours as needed for pain. Please be reevaluated after about 1 week to determine ongoing use, because this can be left on the stomach and kidneys. You may also take Tylenol, use as directed on the labeling.  Return to the emergency room immediately for any worsening, uncontrolled or limiting pain, any weakness or numbness, confusion or altered mental status, black or bloody stools, or any other symptoms concerning to you.

## 2016-06-07 NOTE — ED Notes (Signed)
Pt ambulated by Jeannene Patella, NT through hall. MD made aware

## 2016-06-07 NOTE — ED Notes (Signed)
Daughter given urine cup. Stated we could help pt urinate when he states he needs to urinate.

## 2016-06-07 NOTE — Care Management Note (Signed)
Case Management Note  Patient Details  Name: John Werner MRN: [id number] Date of Birth: [birth year]  Subjective/Objective:          Family at bedside have verified that the patient does have VA benefits, and has had care there in the past. [place] notification sheet placed on chart for the MD to see.          Action/Plan:   Expected Discharge Date:                  Expected Discharge Plan:     In-House Referral:     Discharge planning Services     Post Acute Care Choice:    Choice offered to:     DME Arranged:    DME Agency:     HH Arranged:    HH Agency:     Status of Service:     If discussed at [employer] of Stay Meetings, dates discussed:    Additional Comments:  Beau Fanny, RN 06/07/2016, 1:51 PM

## 2016-12-21 ENCOUNTER — Other Ambulatory Visit (INDEPENDENT_AMBULATORY_CARE_PROVIDER_SITE_OTHER): Payer: Self-pay | Admitting: Vascular Surgery

## 2016-12-21 DIAGNOSIS — I739 Peripheral vascular disease, unspecified: Principal | ICD-10-CM

## 2016-12-21 DIAGNOSIS — I779 Disorder of arteries and arterioles, unspecified: Secondary | ICD-10-CM

## 2016-12-22 ENCOUNTER — Ambulatory Visit (INDEPENDENT_AMBULATORY_CARE_PROVIDER_SITE_OTHER): Payer: Self-pay | Admitting: Vascular Surgery

## 2016-12-22 ENCOUNTER — Encounter (INDEPENDENT_AMBULATORY_CARE_PROVIDER_SITE_OTHER): Payer: Self-pay

## 2017-02-02 ENCOUNTER — Ambulatory Visit (INDEPENDENT_AMBULATORY_CARE_PROVIDER_SITE_OTHER): Payer: Medicare HMO | Admitting: Vascular Surgery

## 2017-02-02 ENCOUNTER — Ambulatory Visit (INDEPENDENT_AMBULATORY_CARE_PROVIDER_SITE_OTHER): Payer: Medicare HMO

## 2017-02-02 VITALS — BP 137/73 | HR 73 | Resp 16 | Wt 134.0 lb

## 2017-02-02 DIAGNOSIS — I6523 Occlusion and stenosis of bilateral carotid arteries: Secondary | ICD-10-CM

## 2017-02-02 DIAGNOSIS — I779 Disorder of arteries and arterioles, unspecified: Secondary | ICD-10-CM

## 2017-02-02 DIAGNOSIS — I6529 Occlusion and stenosis of unspecified carotid artery: Secondary | ICD-10-CM | POA: Insufficient documentation

## 2017-02-02 DIAGNOSIS — I739 Peripheral vascular disease, unspecified: Principal | ICD-10-CM

## 2017-02-02 DIAGNOSIS — E119 Type 2 diabetes mellitus without complications: Secondary | ICD-10-CM | POA: Insufficient documentation

## 2017-02-02 DIAGNOSIS — I10 Essential (primary) hypertension: Secondary | ICD-10-CM

## 2017-02-02 NOTE — Assessment & Plan Note (Signed)
blood pressure control important in reducing the progression of atherosclerotic disease.   

## 2017-02-02 NOTE — Assessment & Plan Note (Signed)
His carotid duplex today reveals a known left ICA occlusion and stable stenosis in the 1-39% range in the right carotid artery.  Both vertebral arteries are antegrade. Continue aspirin.  No intervention recommended.  Recheck in 1 year

## 2017-02-02 NOTE — Progress Notes (Signed)
MRN : [id number]  John Werner is a 83 y.o. ([birth year]) male who presents with chief complaint of  Chief Complaint  Patient presents with  . Carotid    53yr follow up  .  History of Present Illness: Patient returns in follow-up of his carotid disease.  He is at his baseline is had no significant focal neurologic symptoms.  His carotid duplex today reveals a known left ICA occlusion and stable stenosis in the 1-39% range in the right carotid artery.  Both vertebral arteries are antegrade.  Current Outpatient Prescriptions  Medication Sig Dispense Refill  . aspirin EC 81 MG tablet Take by mouth.    . Cyanocobalamin (B-12) 5000 MCG SUBL Place under the tongue.    . diltiazem (CARDIZEM CD) 180 MG 24 hr capsule Take 180 mg by mouth.    Marland Kitchen glipiZIDE (GLUCOTROL XL) 10 MG 24 hr tablet     . glucose blood (ONE TOUCH ULTRA TEST) test strip Use 2 (two) times daily. Diagnosis: E11.65    . hydrochlorothiazide (HYDRODIURIL) 25 MG tablet TAKE 1 TABLET EVERY MORNING    . LEVEMIR 100 UNIT/ML injection     . Multiple Vitamin (MULTI-VITAMINS) TABS Take by mouth.    . nitrofurantoin (MACRODANTIN) 50 MG capsule TAKE 1 CAPSULE (50 MG TOTAL) BY MOUTH NIGHTLY.  3  . pioglitazone (ACTOS) 30 MG tablet Take by mouth.    . ranitidine (ZANTAC) 150 MG capsule Take 1 capsule (150 mg total) by mouth 2 (two) times daily. (Patient not taking: Reported on 02/02/2017) 14 capsule 0   No current facility-administered medications for this visit.     Past Medical History:  Diagnosis Date  . Diabetes mellitus without complication ([place])   . Hypertension     No past surgical history on file.  Social History Social History  Substance Use Topics  . Smoking status: Never Smoker  . Smokeless tobacco: Not on file  . Alcohol use No     Family History No bleeding or clotting disorders  No Known Allergies   REVIEW OF SYSTEMS (Negative unless checked)  Constitutional: [] Weight loss  [] Fever  [] Chills Cardiac:  [] Chest pain   [] Chest pressure   [] Palpitations   [] Shortness of breath when laying flat   [] Shortness of breath at rest   [] Shortness of breath with exertion. Vascular:  [] Pain in legs with walking   [] Pain in legs at rest   [] Pain in legs when laying flat   [] Claudication   [] Pain in feet when walking  [] Pain in feet at rest  [] Pain in feet when laying flat   [] History of DVT   [] Phlebitis   [] Swelling in legs   [] Varicose veins   [] Non-healing ulcers Pulmonary:   [] Uses home oxygen   [] Productive cough   [] Hemoptysis   [] Wheeze  [] COPD   [] Asthma Neurologic:  [] Dizziness  [] Blackouts   [] Seizures   [] History of stroke   [] History of TIA  [] Aphasia   [] Temporary blindness   [] Dysphagia   [] Weakness or numbness in arms   [] Weakness or numbness in legs Musculoskeletal:  [x] Arthritis   [] Joint swelling   [] Joint pain   [] Low back pain Hematologic:  [] Easy bruising  [] Easy bleeding   [] Hypercoagulable state   [] Anemic  [] Hepatitis Gastrointestinal:  [] Blood in stool   [] Vomiting blood  [] Gastroesophageal reflux/heartburn   [] Difficulty swallowing. Genitourinary:  [] Chronic kidney disease   [] Difficult urination  [] Frequent urination  [] Burning with urination   [] Blood in urine Skin:  [] Rashes   []   Ulcers   [] Wounds Psychological:  [] History of anxiety   []  History of major depression.  Physical Examination  Vitals:   02/02/17 1201  BP: 137/73  Pulse: 73  Resp: 16  Weight: 60.8 kg (134 lb)   Body mass index is 21.63 kg/m. Gen:  WD/WN, NAD.  Appears younger than stated age Head: [place]/AT, No temporalis wasting. Ear/Nose/Throat: Hearing grossly intact, nares w/o erythema or drainage, trachea midline Eyes: Conjunctiva clear. Sclera non-icteric Neck: Supple.  No bruit or JVD.  Pulmonary:  Good air movement, equal and clear to auscultation bilaterally.  Cardiac: RRR, no JVD Vascular:  Vessel Right Left  Radial Palpable Palpable                                    Musculoskeletal: M/S  5/5 throughout.  No deformity or atrophy.  Neurologic: CN 2-12 intact. Sensation grossly intact in extremities.  Symmetrical.  Speech is fluent. Motor exam as listed above. Psychiatric: Judgment and insight seem fair at best Dermatologic: No rashes or ulcers noted.  No cellulitis or open wounds. Lymph : No Cervical, Axillary, or Inguinal lymphadenopathy.     CBC Lab Results  Component Value Date   WBC 8.7 06/07/2016   HGB 13.6 06/07/2016   HCT 40.2 06/07/2016   MCV 83.2 06/07/2016   PLT 86 (L) 06/07/2016    BMET    Component Value Date/Time   NA 140 06/07/2016 1419   K 4.1 06/07/2016 1419   CL 103 06/07/2016 1419   CO2 27 06/07/2016 1419   GLUCOSE 253 (H) 06/07/2016 1419   BUN 14 06/07/2016 1419   CREATININE 0.72 06/07/2016 1419   CALCIUM 9.2 06/07/2016 1419   GFRNONAA >60 06/07/2016 1419   GFRAA >60 06/07/2016 1419   CrCl cannot be calculated (Patient's most recent lab result is older than the maximum 21 days allowed.).  COAG No results found for: INR, PROTIME  Radiology No results found.    Assessment/Plan Diabetes mellitus without complication (HCC) blood glucose control important in reducing the progression of atherosclerotic disease. Also, involved in wound healing.    Hypertension blood pressure control important in reducing the progression of atherosclerotic disease.    Carotid stenosis His carotid duplex today reveals a known left ICA occlusion and stable stenosis in the 1-39% range in the right carotid artery.  Both vertebral arteries are antegrade. Continue aspirin.  No intervention recommended.  Recheck in 1 year    Leotis Pain, MD  02/02/2017 2:28 PM    This note was created with Dragon medical transcription system.  Any errors from dictation are purely unintentional

## 2017-02-02 NOTE — Assessment & Plan Note (Signed)
blood glucose control important in reducing the progression of atherosclerotic disease. Also, involved in wound healing.   

## 2017-02-02 NOTE — Patient Instructions (Signed)
Carotid Artery Disease The carotid arteries are arteries on both sides of the neck. They carry blood to the brain. Carotid artery disease is when the arteries get smaller (narrow) or get blocked. If these arteries get smaller or get blocked, you are more likely to have a stroke or warning stroke (transient ischemic attack). Follow these instructions at home:  Take medicines as told by your doctor. Make sure you understand all your medicine instructions. Do not stop your medicines without talking to your doctor first.  Follow your doctor's diet instructions. It is important to eat a healthy diet that includes plenty of: ? Fresh fruits. ? Vegetables. ? Lean meats.  Avoid: ? High-fat foods. ? High-sodium foods. ? Foods that are fried, overly processed, or have poor nutritional value.  Stay a healthy weight.  Stay active. Get at least 30 minutes of activity every day.  Do not smoke.  Limit alcohol use to: ? No more than 2 drinks a day for men. ? No more than 1 drink a day for women who are not pregnant.  Do not use illegal drugs.  Keep all doctor visits as told. Get help right away if:  You have sudden weakness or loss of feeling (numbness) on one side of the body, such as the face, arm, or leg.  You have sudden confusion.  You have trouble speaking (aphasia) or understanding.  You have sudden trouble seeing out of one or both eyes.  You have sudden trouble walking.  You have dizziness or feel like you might pass out (faint).  You have a loss of balance or your movements are not steady (uncoordinated).  You have a sudden, severe headache with no known cause.  You have trouble swallowing (dysphagia). Call your local emergency services (911 in [place] Do notdrive yourself to the clinic or hospital. This information is not intended to replace advice given to you by your health care provider. Make sure you discuss any questions you have with your health care  provider. Document Released: 03/13/2012 Document Revised: 09/02/2015 Document Reviewed: 09/25/2012 Elsevier Interactive Patient Education  2018 Elsevier Inc.  

## 2017-03-26 ENCOUNTER — Other Ambulatory Visit: Payer: Self-pay | Admitting: Internal Medicine

## 2017-03-26 DIAGNOSIS — M79604 Pain in right leg: Secondary | ICD-10-CM

## 2017-04-26 ENCOUNTER — Ambulatory Visit: Admission: RE | Admit: 2017-04-26 | Payer: Medicare HMO | Source: Ambulatory Visit

## 2017-05-05 ENCOUNTER — Emergency Department: Payer: Medicare HMO

## 2017-05-05 ENCOUNTER — Encounter: Payer: Self-pay | Admitting: Emergency Medicine

## 2017-05-05 ENCOUNTER — Emergency Department
Admission: EM | Admit: 2017-05-05 | Discharge: 2017-05-05 | Disposition: A | Discharge status: Home / Self Care | Payer: Medicare HMO | Attending: Student in an Organized Health Care Education/Training Program | Admitting: Student in an Organized Health Care Education/Training Program

## 2017-05-05 ENCOUNTER — Other Ambulatory Visit: Payer: Self-pay

## 2017-05-05 DIAGNOSIS — I10 Essential (primary) hypertension: Secondary | ICD-10-CM | POA: Insufficient documentation

## 2017-05-05 DIAGNOSIS — Z87891 Personal history of nicotine dependence: Secondary | ICD-10-CM | POA: Diagnosis not present

## 2017-05-05 DIAGNOSIS — Z7982 Long term (current) use of aspirin: Secondary | ICD-10-CM | POA: Insufficient documentation

## 2017-05-05 DIAGNOSIS — L02211 Cutaneous abscess of abdominal wall: Secondary | ICD-10-CM

## 2017-05-05 DIAGNOSIS — G309 Alzheimer's disease, unspecified: Secondary | ICD-10-CM | POA: Insufficient documentation

## 2017-05-05 DIAGNOSIS — E119 Type 2 diabetes mellitus without complications: Secondary | ICD-10-CM | POA: Diagnosis not present

## 2017-05-05 DIAGNOSIS — R21 Rash and other nonspecific skin eruption: Secondary | ICD-10-CM | POA: Diagnosis present

## 2017-05-05 DIAGNOSIS — L03311 Cellulitis of abdominal wall: Secondary | ICD-10-CM

## 2017-05-05 DIAGNOSIS — Z8673 Personal history of transient ischemic attack (TIA), and cerebral infarction without residual deficits: Secondary | ICD-10-CM | POA: Diagnosis not present

## 2017-05-05 DIAGNOSIS — Z794 Long term (current) use of insulin: Secondary | ICD-10-CM | POA: Diagnosis not present

## 2017-05-05 HISTORY — DX: Other amnesia: R41.3

## 2017-05-05 HISTORY — DX: Unspecified atrial fibrillation: I48.91

## 2017-05-05 HISTORY — DX: Hyperlipidemia, unspecified: E78.5

## 2017-05-05 HISTORY — DX: Heart disease, unspecified: I51.9

## 2017-05-05 HISTORY — DX: Nonrheumatic aortic valve disorder, unspecified: I35.9

## 2017-05-05 HISTORY — DX: Personal history of colon polyps, unspecified: Z86.0100

## 2017-05-05 HISTORY — DX: Personal history of colonic polyps: Z86.010

## 2017-05-05 HISTORY — DX: Transient cerebral ischemic attack, unspecified: G45.9

## 2017-05-05 HISTORY — DX: Occlusion and stenosis of unspecified carotid artery: I65.29

## 2017-05-05 HISTORY — DX: Unspecified dementia, unspecified severity, without behavioral disturbance, psychotic disturbance, mood disturbance, and anxiety: F03.90

## 2017-05-05 HISTORY — DX: Presence of artificial eye: Z97.0

## 2017-05-05 LAB — CBC
HCT: 37.4 % — ABNORMAL LOW (ref 40.0–52.0)
HEMOGLOBIN: 12.1 g/dL — AB (ref 13.0–18.0)
MCH: 27.8 pg (ref 26.0–34.0)
MCHC: 32.3 g/dL (ref 32.0–36.0)
MCV: 85.9 fL (ref 80.0–100.0)
Platelets: 118 10*3/uL — ABNORMAL LOW (ref 150–440)
RBC: 4.35 MIL/uL — AB (ref 4.40–5.90)
RDW: 16.1 % — ABNORMAL HIGH (ref 11.5–14.5)
WBC: 6.6 10*3/uL (ref 3.8–10.6)

## 2017-05-05 LAB — COMPREHENSIVE METABOLIC PANEL
ALK PHOS: 80 U/L (ref 38–126)
ALT: 22 U/L (ref 17–63)
ANION GAP: 9 (ref 5–15)
AST: 35 U/L (ref 15–41)
Albumin: 3.4 g/dL — ABNORMAL LOW (ref 3.5–5.0)
BILIRUBIN TOTAL: 0.7 mg/dL (ref 0.3–1.2)
BUN: 18 mg/dL (ref 6–20)
CALCIUM: 9.2 mg/dL (ref 8.9–10.3)
CO2: 27 mmol/L (ref 22–32)
Chloride: 106 mmol/L (ref 101–111)
Creatinine, Ser: 0.73 mg/dL (ref 0.61–1.24)
GLUCOSE: 146 mg/dL — AB (ref 65–99)
Potassium: 3.9 mmol/L (ref 3.5–5.1)
Sodium: 142 mmol/L (ref 135–145)
TOTAL PROTEIN: 6.8 g/dL (ref 6.5–8.1)

## 2017-05-05 LAB — LIPASE, BLOOD: LIPASE: 20 U/L (ref 11–51)

## 2017-05-05 MED ORDER — CLINDAMYCIN HCL 300 MG PO CAPS: 300.0000 mg | ORAL_CAPSULE | Freq: Three times a day (TID) | ORAL | 0 refills | Status: AC

## 2017-05-05 MED ORDER — LIDOCAINE HCL (PF) 1 % IJ SOLN
5.0000 mL | Freq: Once | INTRAMUSCULAR | Status: AC
  Administered 2017-05-05: 5 mL via INTRADERMAL
  Filled 2017-05-05: qty 5

## 2017-05-05 MED ORDER — IOPAMIDOL (ISOVUE-300) INJECTION 61%
75.0000 mL | Freq: Once | INTRAVENOUS | Status: AC | PRN
  Administered 2017-05-05: 75 mL via INTRAVENOUS

## 2017-05-05 MED ORDER — CLINDAMYCIN PHOSPHATE 600 MG/50ML IV SOLN
600.0000 mg | Freq: Once | INTRAVENOUS | Status: AC
  Administered 2017-05-05: 600 mg via INTRAVENOUS
  Filled 2017-05-05: qty 50

## 2017-05-05 NOTE — ED Provider Notes (Signed)
[facility] Emergency Department Provider Note    First MD Initiated Contact with Patient 05/05/17 1330     (approximate)  I have reviewed the triage vital signs and the nursing notes.   HISTORY  Chief Complaint Abdominal Pain; Cellulitis; and Abscess    HPI John Werner is a 84 y.o. male presents with worsening swelling and redness with some discomfort to the right lower quadrant.  Patient gives himself insulin injections.  Does have a history of Alzheimer's dementia and has been given the.  Daughter at bedside states that he uses a new needle every time but is uncertain if he has been doing a good job sterilizing cleaning the skin.  No fevers.  Was seen at PCPs office this week and was given a dose of Rocephin and started on doxycycline but the swelling has continued to worsen.  Today she noticed worsening redness crossing the umbilicus so she brought him to the ER for further evaluation.  Past Medical History:  Diagnosis Date  . Atrial fibrillation ([place])   . AVD (aortic valve disease)   . Carotid stenosis   . Dementia   . Diabetes mellitus without complication ([place])   . Glass eye   . Heart disease   . History of colonic polyps   . Hyperlipidemia   . Hypertension   . Memory loss   . TIA (transient ischemic attack)    History reviewed. No pertinent family history. Past Surgical History:  Procedure Laterality Date  . CATARACT EXTRACTION Left    Patient Active Problem List   Diagnosis Date Noted  . Diabetes mellitus without complication ([place]) 14/48/1856  . Hypertension 02/02/2017  . Carotid stenosis 02/02/2017      Prior to Admission medications   Medication Sig Start Date End Date Taking? Authorizing Provider  aspirin EC 81 MG tablet Take by mouth.    [provider]  Cyanocobalamin (B-12) 5000 MCG SUBL Place under the tongue.    [provider]  diltiazem (CARDIZEM CD) 180 MG 24 hr capsule Take 180 mg by mouth. 01/18/11    [provider]  glipiZIDE (GLUCOTROL XL) 10 MG 24 hr tablet  02/01/17   [provider]  glucose blood (ONE TOUCH ULTRA TEST) test strip Use 2 (two) times daily. Diagnosis: E11.65 10/30/16   [provider]  hydrochlorothiazide (HYDRODIURIL) 25 MG tablet TAKE 1 TABLET EVERY MORNING 09/29/11   [provider]  LEVEMIR 100 UNIT/ML injection  02/01/17   [provider]  Multiple Vitamin (MULTI-VITAMINS) TABS Take by mouth. 06/14/09   [provider]  nitrofurantoin (MACRODANTIN) 50 MG capsule TAKE 1 CAPSULE (50 MG TOTAL) BY MOUTH NIGHTLY. 12/31/16   [provider]  pioglitazone (ACTOS) 30 MG tablet Take by mouth. 07/25/16   [provider]  ranitidine (ZANTAC) 150 MG capsule Take 1 capsule (150 mg total) by mouth 2 (two) times daily. Patient not taking: Reported on 02/02/2017 09/20/15   Carrie Mew, MD    Allergies Captopril    Social History Social History   Tobacco Use  . Smoking status: Former Smoker    Types: Cigars  . Smokeless tobacco: Never Used  Substance Use Topics  . Alcohol use: No  . Drug use: No    Review of Systems Patient denies headaches, rhinorrhea, blurry vision, numbness, shortness of breath, chest pain, edema, cough, abdominal pain, nausea, vomiting, diarrhea, dysuria, fevers, rashes or hallucinations unless otherwise stated above in HPI. ____________________________________________   PHYSICAL EXAM:  VITAL  SIGNS: Vitals:   05/05/17 1138  BP: 133/71  Pulse: 91  Resp: 18  Temp: (!) 97.3 F (36.3 C)  SpO2: 96%    Constitutional: Alert a Well appearing and in no acute distress. Eyes: Conjunctivae are normal.  Head: Atraumatic. Nose: No congestion/rhinnorhea. Mouth/Throat: Mucous membranes are moist.   Neck: No stridor. Painless ROM.  Cardiovascular: Normal rate, regular rhythm. Grossly normal heart sounds.  Good peripheral circulation. Respiratory: Normal respiratory effort.   No retractions. Lungs CTAB. Gastrointestinal: There is a large roughly tennis ball size fluctuant mass just inferolateral to the right side of the umbilicus with surrounding cellulitis and overlying warmth.  No crepitus.  Otherwise the abdomen is nontender  No distention. No abdominal bruits. No CVA tenderness. Genitourinary:  Musculoskeletal: No lower extremity tenderness nor edema.  No joint effusions. Neurologic:  Normal speech and language. No gross focal neurologic deficits are appreciated. No facial droop Skin:  Skin is warm, dry and intact. No rash noted. Psychiatric: Mood and affect are normal. Speech and behavior are normal.  ____________________________________________   LABS (all labs ordered are listed, but only abnormal results are displayed)  Results for orders placed or performed during the hospital encounter of 05/05/17 (from the past 24 hour(s))  Lipase, blood     Status: None   Collection Time: 05/05/17 11:42 AM  Result Value Ref Range   Lipase 20 11 - 51 U/L  Comprehensive metabolic panel     Status: Abnormal   Collection Time: 05/05/17 11:42 AM  Result Value Ref Range   Sodium 142 135 - 145 mmol/L   Potassium 3.9 3.5 - 5.1 mmol/L   Chloride 106 101 - 111 mmol/L   CO2 27 22 - 32 mmol/L   Glucose, Bld 146 (H) 65 - 99 mg/dL   BUN 18 6 - 20 mg/dL   Creatinine, Ser 0.73 0.61 - 1.24 mg/dL   Calcium 9.2 8.9 - 10.3 mg/dL   Total Protein 6.8 6.5 - 8.1 g/dL   Albumin 3.4 (L) 3.5 - 5.0 g/dL   AST 35 15 - 41 U/L   ALT 22 17 - 63 U/L   Alkaline Phosphatase 80 38 - 126 U/L   Total Bilirubin 0.7 0.3 - 1.2 mg/dL   GFR calc non Af Amer >60 >60 mL/min   GFR calc Af Amer >60 >60 mL/min   Anion gap 9 5 - 15  CBC     Status: Abnormal   Collection Time: 05/05/17 11:42 AM  Result Value Ref Range   WBC 6.6 3.8 - 10.6 K/uL   RBC 4.35 (L) 4.40 - 5.90 MIL/uL   Hemoglobin 12.1 (L) 13.0 - 18.0 g/dL   HCT 37.4 (L) 40.0 - 52.0 %   MCV 85.9 80.0 - 100.0 fL   MCH 27.8 26.0 - 34.0 pg     MCHC 32.3 32.0 - 36.0 g/dL   RDW 16.1 (H) 11.5 - 14.5 %   Platelets 118 (L) 150 - 440 K/uL   ____________________________________________ ____________________________________________  RADIOLOGY  I personally reviewed all radiographic images ordered to evaluate for the above acute complaints and reviewed radiology reports and findings.  These findings were personally discussed with the patient.  Please see medical record for radiology report.  ____________________________________________   PROCEDURES  Procedure(s) performed:  Marland KitchenMarland KitchenIncision and Drainage Date/Time: 05/05/2017 4:05 PM Performed by: Merlyn Lot, MD Authorized by: Merlyn Lot, MD   Consent:    Consent obtained:  Verbal   Consent given by:  Patient   Risks discussed:  Bleeding, infection, incomplete drainage and pain   Alternatives discussed:  Alternative treatment, delayed treatment and observation Location:    Type:  Abscess   Location:  Trunk   Trunk location:  Abdomen Pre-procedure details:    Skin preparation:  Betadine Anesthesia (see MAR for exact dosages):    Anesthesia method:  Local infiltration   Local anesthetic:  Lidocaine 1% w/o epi Procedure type:    Complexity:  Complex Procedure details:    Incision types:  Stab incision   Incision depth:  Subcutaneous   Scalpel blade:  11   Wound management:  Probed and deloculated and extensive cleaning   Drainage:  Purulent   Drainage amount:  Copious   Wound treatment:  Drain placed   Packing materials:  1/4 in iodoform gauze   Amount 1/4" iodoform:  10 Post-procedure details:    Patient tolerance of procedure:  Tolerated well, no immediate complications      Critical Care performed: no ____________________________________________   INITIAL IMPRESSION / ASSESSMENT AND PLAN / ED COURSE  Pertinent labs & imaging results that were available during my care of the patient were reviewed by me and considered in my medical decision making  (see chart for details).  DDX: abscess, hernia, phlegmon, cellulitis, retained foreign body  Cashel Bellina is a 84 y.o. who presents to the ED with symptoms as described above.  CT imaging shows no evidence of hernia retained foreign body.  Based on clinical exam most consistent with abdominal wall abscess amenable to drainage.  I&D performed with copious amount of purulent drainage.  Based on size packing was placed.  He has no signs of sepsis and white count is normal.  Will transition to clindamycin as doxycycline not appropriate treating the patient at this time.  At this point I do believe patient is stable and appropriate for discharge home with trial of outpatient management.  Discussed signs and symptoms for which he should return immediately to the hospital and that he should return in 4-48 hours for wound check and removal of iodoform packing.      ____________________________________________   FINAL CLINICAL IMPRESSION(S) / ED DIAGNOSES  Final diagnoses:  Abscess of skin of abdomen  Cellulitis of abdominal wall      NEW MEDICATIONS STARTED DURING THIS VISIT:  New Prescriptions   No medications on file     Note:  This document was prepared using Dragon voice recognition software and may include unintentional dictation errors.    Merlyn Lot, MD 05/05/17 [phone]

## 2017-05-05 NOTE — Discharge Instructions (Signed)
Follow-up in clinic or in the ER in 24-48 hours for wound check and removal of packing.  Return sooner if you develop any fevers worsening swelling erythema or for any additional questions or concerns.  You have been seen in the emergency department for emergency care. It is important that you contact your own doctor, specialist or the closest clinic for follow-up care. Please bring this instruction sheet, all medications and X-ray copies with you when you are seen for follow-up care.  Determining the exact cause for all patients with abdominal pain is extremely difficult in the emergency department. Our primary focus is to rule-out immediate life-threatening diseases. If no immediate source of pain is found the definitive diagnosis frequently needs to be determined over time.Many times your primary care physician can determine the cause by following the symptoms over time. Sometimes, specialist are required such as Gastroenterologists, Gynecologists, Urologists or Surgeons. Please return immediately to the Emergency Department for fever>101, Vomiting or Intractable Pain. You should return to the emergency department or see your primary care provider in 12-24hrs if your pain is no better and sooner if your pain becomes worse.

## 2017-05-05 NOTE — ED Triage Notes (Signed)
Pt to ED from home with daughter c/o right lower abd abscess and cellulitis since Wednesday and getting worse and with spreading redness.  Was seen at PCP and given ABX and prescribed PO doxycycline.  Pt has DBM and gives his own injections and unsure if rotates sites. Site is warm and firm, no drainage.

## 2017-05-05 NOTE — ED Notes (Signed)
FIRST NURSE NOTE:  Dx with cellulitis of the abdominal wall, states infection is getting worse. Given Rocephin on 1/24 at [facility], also on Doxycycline 100mg  PO BID

## 2017-05-07 ENCOUNTER — Encounter: Payer: Self-pay | Admitting: Emergency Medicine

## 2017-05-07 ENCOUNTER — Emergency Department
Admission: EM | Admit: 2017-05-07 | Discharge: 2017-05-07 | Disposition: A | Discharge status: Home / Self Care | Payer: Medicare HMO | Attending: Emergency Medicine | Admitting: Emergency Medicine

## 2017-05-07 ENCOUNTER — Other Ambulatory Visit: Payer: Self-pay

## 2017-05-07 DIAGNOSIS — I10 Essential (primary) hypertension: Secondary | ICD-10-CM | POA: Insufficient documentation

## 2017-05-07 DIAGNOSIS — Z7984 Long term (current) use of oral hypoglycemic drugs: Secondary | ICD-10-CM | POA: Diagnosis not present

## 2017-05-07 DIAGNOSIS — I4891 Unspecified atrial fibrillation: Secondary | ICD-10-CM | POA: Diagnosis not present

## 2017-05-07 DIAGNOSIS — Z7982 Long term (current) use of aspirin: Secondary | ICD-10-CM | POA: Diagnosis not present

## 2017-05-07 DIAGNOSIS — Z4801 Encounter for change or removal of surgical wound dressing: Secondary | ICD-10-CM | POA: Diagnosis not present

## 2017-05-07 DIAGNOSIS — Z79899 Other long term (current) drug therapy: Secondary | ICD-10-CM | POA: Insufficient documentation

## 2017-05-07 DIAGNOSIS — Z87891 Personal history of nicotine dependence: Secondary | ICD-10-CM | POA: Insufficient documentation

## 2017-05-07 DIAGNOSIS — Z5189 Encounter for other specified aftercare: Secondary | ICD-10-CM

## 2017-05-07 DIAGNOSIS — E119 Type 2 diabetes mellitus without complications: Secondary | ICD-10-CM | POA: Insufficient documentation

## 2017-05-07 NOTE — ED Triage Notes (Signed)
Presents with family for wound check  States he needs packing removed from abd

## 2017-05-07 NOTE — Discharge Instructions (Signed)
Change dressing daily until healing is complete.

## 2017-05-07 NOTE — ED Provider Notes (Signed)
[facility] Emergency Department Provider Note   ____________________________________________   None    (approximate)  I have reviewed the triage vital signs and the nursing notes.   HISTORY  Chief Complaint Wound Check    HPI John Werner is a 84 y.o. male patient return for wound check status post I&D and abdominal abscess.  Patient voices no complaints.  Past Medical History:  Diagnosis Date  . Atrial fibrillation ([place])   . AVD (aortic valve disease)   . Carotid stenosis   . Dementia   . Diabetes mellitus without complication ([place])   . Glass eye   . Heart disease   . History of colonic polyps   . Hyperlipidemia   . Hypertension   . Memory loss   . TIA (transient ischemic attack)     Patient Active Problem List   Diagnosis Date Noted  . Diabetes mellitus without complication ([place]) 66/29/4765  . Hypertension 02/02/2017  . Carotid stenosis 02/02/2017    Past Surgical History:  Procedure Laterality Date  . CATARACT EXTRACTION Left     Prior to Admission medications   Medication Sig Start Date End Date Taking? Authorizing Provider  aspirin EC 81 MG tablet Take by mouth.    [provider]  clindamycin (CLEOCIN) 300 MG capsule Take 1 capsule (300 mg total) by mouth 3 (three) times daily for 10 days. 05/05/17 05/15/17  Merlyn Lot, MD  Cyanocobalamin (B-12) 5000 MCG SUBL Place under the tongue.    [provider]  diltiazem (CARDIZEM CD) 180 MG 24 hr capsule Take 180 mg by mouth. 01/18/11   [provider]  glipiZIDE (GLUCOTROL XL) 10 MG 24 hr tablet  02/01/17   [provider]  glucose blood (ONE TOUCH ULTRA TEST) test strip Use 2 (two) times daily. Diagnosis: E11.65 10/30/16   [provider]  hydrochlorothiazide (HYDRODIURIL) 25 MG tablet TAKE 1 TABLET EVERY MORNING 09/29/11   [provider]  LEVEMIR 100 UNIT/ML injection  02/01/17   [provider]  Multiple Vitamin  (MULTI-VITAMINS) TABS Take by mouth. 06/14/09   [provider]  nitrofurantoin (MACRODANTIN) 50 MG capsule TAKE 1 CAPSULE (50 MG TOTAL) BY MOUTH NIGHTLY. 12/31/16   [provider]  pioglitazone (ACTOS) 30 MG tablet Take by mouth. 07/25/16   [provider]  ranitidine (ZANTAC) 150 MG capsule Take 1 capsule (150 mg total) by mouth 2 (two) times daily. Patient not taking: Reported on 02/02/2017 09/20/15   Carrie Mew, MD    Allergies Captopril  No family history on file.  Social History Social History   Tobacco Use  . Smoking status: Former Smoker    Types: Cigars  . Smokeless tobacco: Never Used  Substance Use Topics  . Alcohol use: No  . Drug use: No    Review of Systems  Constitutional: No fever/chills Eyes: No visual changes. ENT: No sore throat. Cardiovascular: Denies chest pain. Respiratory: Denies shortness of breath. Gastrointestinal: No abdominal pain.  No nausea, no vomiting.  No diarrhea.  No constipation. Genitourinary: Negative for dysuria. Musculoskeletal: Negative for back pain. Skin: Negative for rash. Neurological: Negative for headaches, focal weakness or numbness. Psychiatric:Dementia Endocrine:Diabetes, hyperlipidemia, and hypertension. Allergic/Immunilogical: See medication list  ____________________________________________   PHYSICAL EXAM:  VITAL SIGNS: ED Triage Vitals  Enc Vitals Group     BP 05/07/17 1553 (!) 149/84     Pulse Rate 05/07/17 1553 81     Resp 05/07/17 1553 20     Temp 05/07/17  1553 (!) 97.5 F (36.4 C)     Temp Source 05/07/17 1553 Oral     SpO2 05/07/17 1553 100 %     Weight 05/07/17 1551 130 lb (59 kg)     Height 05/07/17 1551 5\' 7"  (1.702 m)     Head Circumference --      Peak Flow --      Pain Score --      Pain Loc --      Pain Edu? --      Excl. in [place]? --    Constitutional: Alert and oriented. Well appearing and in no acute distress. Cardiovascular: Normal rate, regular rhythm.  Grossly normal heart sounds.  Good peripheral circulation. Respiratory: Normal respiratory effort.  No retractions. Lungs CTAB. Gastrointestinal: Soft and nontender. No distention. No abdominal bruits. No CVA tenderness. Neurologic:  Normal speech and language. No gross focal neurologic deficits are appreciated. No gait instability. Skin: Receding edema erythema infraumbilical area as noted from skin marking.   Psychiatric: Mood and affect are normal. Speech and behavior are normal.  ____________________________________________   LABS (all labs ordered are listed, but only abnormal results are displayed)  Labs Reviewed - No data to display ____________________________________________  EKG   ____________________________________________  RADIOLOGY    ____________________________________________   PROCEDURES  Procedure(s) performed: None  Procedures  Critical Care performed: No  ____________________________________________   INITIAL IMPRESSION / ASSESSMENT AND PLAN / ED COURSE  As part of my medical decision making, I reviewed the following data within the electronic MEDICAL RECORD NUMBER    Resolving abscess abdominal area.  Iodoform packing tape was removed.  Cavity was irrigated with normal saline with clear return.  Area was recleansed and bandaged.  Advised to continue previous medications and follow-up with PCP.  Return to ED if condition worsens..      ____________________________________________   FINAL CLINICAL IMPRESSION(S) / ED DIAGNOSES  Final diagnoses:  Wound check, abscess     ED Discharge Orders    None       Note:  This document was prepared using Dragon voice recognition software and may include unintentional dictation errors.    Sable Feil, PA-C 05/07/17 1618    Hinda Kehr, MD 05/07/17 1721

## 2017-07-27 ENCOUNTER — Emergency Department: Payer: Medicare HMO

## 2017-07-27 ENCOUNTER — Other Ambulatory Visit: Payer: Self-pay

## 2017-07-27 ENCOUNTER — Inpatient Hospital Stay
Admission: EM | Admit: 2017-07-27 | Discharge: 2017-07-28 | DRG: 202 | Disposition: A | Discharge status: Home / Self Care | Payer: Medicare HMO | Attending: Internal Medicine | Admitting: Internal Medicine

## 2017-07-27 DIAGNOSIS — Z79899 Other long term (current) drug therapy: Secondary | ICD-10-CM

## 2017-07-27 DIAGNOSIS — W19XXXA Unspecified fall, initial encounter: Secondary | ICD-10-CM | POA: Diagnosis present

## 2017-07-27 DIAGNOSIS — J209 Acute bronchitis, unspecified: Principal | ICD-10-CM | POA: Diagnosis present

## 2017-07-27 DIAGNOSIS — I359 Nonrheumatic aortic valve disorder, unspecified: Secondary | ICD-10-CM | POA: Diagnosis present

## 2017-07-27 DIAGNOSIS — R2689 Other abnormalities of gait and mobility: Secondary | ICD-10-CM | POA: Diagnosis present

## 2017-07-27 DIAGNOSIS — J90 Pleural effusion, not elsewhere classified: Secondary | ICD-10-CM | POA: Diagnosis present

## 2017-07-27 DIAGNOSIS — R410 Disorientation, unspecified: Secondary | ICD-10-CM

## 2017-07-27 DIAGNOSIS — F0390 Unspecified dementia without behavioral disturbance: Secondary | ICD-10-CM | POA: Diagnosis present

## 2017-07-27 DIAGNOSIS — Z7982 Long term (current) use of aspirin: Secondary | ICD-10-CM

## 2017-07-27 DIAGNOSIS — Z888 Allergy status to other drugs, medicaments and biological substances status: Secondary | ICD-10-CM

## 2017-07-27 DIAGNOSIS — Z8673 Personal history of transient ischemic attack (TIA), and cerebral infarction without residual deficits: Secondary | ICD-10-CM

## 2017-07-27 DIAGNOSIS — Z87891 Personal history of nicotine dependence: Secondary | ICD-10-CM | POA: Diagnosis not present

## 2017-07-27 DIAGNOSIS — I10 Essential (primary) hypertension: Secondary | ICD-10-CM | POA: Diagnosis present

## 2017-07-27 DIAGNOSIS — Y92009 Unspecified place in unspecified non-institutional (private) residence as the place of occurrence of the external cause: Secondary | ICD-10-CM

## 2017-07-27 DIAGNOSIS — R05 Cough: Secondary | ICD-10-CM

## 2017-07-27 DIAGNOSIS — I4891 Unspecified atrial fibrillation: Secondary | ICD-10-CM | POA: Diagnosis present

## 2017-07-27 DIAGNOSIS — E119 Type 2 diabetes mellitus without complications: Secondary | ICD-10-CM | POA: Diagnosis present

## 2017-07-27 DIAGNOSIS — J9811 Atelectasis: Secondary | ICD-10-CM | POA: Diagnosis present

## 2017-07-27 DIAGNOSIS — Z794 Long term (current) use of insulin: Secondary | ICD-10-CM

## 2017-07-27 DIAGNOSIS — E782 Mixed hyperlipidemia: Secondary | ICD-10-CM | POA: Diagnosis present

## 2017-07-27 DIAGNOSIS — J189 Pneumonia, unspecified organism: Secondary | ICD-10-CM | POA: Diagnosis present

## 2017-07-27 DIAGNOSIS — J181 Lobar pneumonia, unspecified organism: Secondary | ICD-10-CM

## 2017-07-27 DIAGNOSIS — R059 Cough, unspecified: Secondary | ICD-10-CM

## 2017-07-27 LAB — CBC WITH DIFFERENTIAL/PLATELET
Basophils Absolute: 0 10*3/uL (ref 0–0.1)
Basophils Relative: 0 %
EOS ABS: 0 10*3/uL (ref 0–0.7)
Eosinophils Relative: 0 %
HEMATOCRIT: 35.4 % — AB (ref 40.0–52.0)
HEMOGLOBIN: 11.7 g/dL — AB (ref 13.0–18.0)
LYMPHS ABS: 0.6 10*3/uL — AB (ref 1.0–3.6)
LYMPHS PCT: 9 %
MCH: 28 pg (ref 26.0–34.0)
MCHC: 33.1 g/dL (ref 32.0–36.0)
MCV: 84.7 fL (ref 80.0–100.0)
Monocytes Absolute: 0.6 10*3/uL (ref 0.2–1.0)
Monocytes Relative: 10 %
NEUTROS ABS: 5.2 10*3/uL (ref 1.4–6.5)
NEUTROS PCT: 81 %
Platelets: 108 10*3/uL — ABNORMAL LOW (ref 150–440)
RBC: 4.18 MIL/uL — AB (ref 4.40–5.90)
RDW: 16.3 % — ABNORMAL HIGH (ref 11.5–14.5)
WBC: 6.4 10*3/uL (ref 3.8–10.6)

## 2017-07-27 LAB — INFLUENZA PANEL BY PCR (TYPE A & B)
Influenza A By PCR: NEGATIVE
Influenza B By PCR: NEGATIVE

## 2017-07-27 LAB — PROTIME-INR
INR: 1.11
PROTHROMBIN TIME: 14.2 s (ref 11.4–15.2)

## 2017-07-27 LAB — LIPASE, BLOOD: Lipase: 26 U/L (ref 11–51)

## 2017-07-27 LAB — URINALYSIS, COMPLETE (UACMP) WITH MICROSCOPIC
BACTERIA UA: NONE SEEN
BILIRUBIN URINE: NEGATIVE
Glucose, UA: NEGATIVE mg/dL
HGB URINE DIPSTICK: NEGATIVE
KETONES UR: NEGATIVE mg/dL
Leukocytes, UA: NEGATIVE
Nitrite: NEGATIVE
Protein, ur: NEGATIVE mg/dL
SPECIFIC GRAVITY, URINE: 1.016 (ref 1.005–1.030)
pH: 7 (ref 5.0–8.0)

## 2017-07-27 LAB — COMPREHENSIVE METABOLIC PANEL
ALT: 30 U/L (ref 17–63)
ANION GAP: 9 (ref 5–15)
AST: 53 U/L — ABNORMAL HIGH (ref 15–41)
Albumin: 3.8 g/dL (ref 3.5–5.0)
Alkaline Phosphatase: 72 U/L (ref 38–126)
BUN: 21 mg/dL — ABNORMAL HIGH (ref 6–20)
CHLORIDE: 107 mmol/L (ref 101–111)
CO2: 26 mmol/L (ref 22–32)
CREATININE: 0.66 mg/dL (ref 0.61–1.24)
Calcium: 9.4 mg/dL (ref 8.9–10.3)
Glucose, Bld: 106 mg/dL — ABNORMAL HIGH (ref 65–99)
POTASSIUM: 3.9 mmol/L (ref 3.5–5.1)
SODIUM: 142 mmol/L (ref 135–145)
Total Bilirubin: 0.6 mg/dL (ref 0.3–1.2)
Total Protein: 7.7 g/dL (ref 6.5–8.1)

## 2017-07-27 LAB — MRSA PCR SCREENING: MRSA by PCR: NEGATIVE

## 2017-07-27 LAB — TROPONIN I: TROPONIN I: 0.03 ng/mL — AB (ref <0.03)

## 2017-07-27 LAB — BRAIN NATRIURETIC PEPTIDE: B Natriuretic Peptide: 548 pg/mL — ABNORMAL HIGH (ref 0.0–100.0)

## 2017-07-27 LAB — PROCALCITONIN: Procalcitonin: <0.1 ng/mL

## 2017-07-27 LAB — GLUCOSE, CAPILLARY: Glucose-Capillary: 151 mg/dL — ABNORMAL HIGH (ref 65–99)

## 2017-07-27 LAB — LACTIC ACID, PLASMA: LACTIC ACID, VENOUS: 1.3 mmol/L (ref 0.5–1.9)

## 2017-07-27 MED ORDER — DILTIAZEM HCL 100 MG IV SOLR
5.0000 mg/h | INTRAVENOUS | Status: DC
  Filled 2017-07-27: qty 100

## 2017-07-27 MED ORDER — SODIUM CHLORIDE 0.9% FLUSH: 3.0000 mL | INTRAVENOUS | Status: DC | PRN

## 2017-07-27 MED ORDER — METOPROLOL TARTRATE 5 MG/5ML IV SOLN: 2.5000 mg | INTRAVENOUS | Status: DC | PRN

## 2017-07-27 MED ORDER — DILTIAZEM HCL 25 MG/5ML IV SOLN
15.0000 mg | Freq: Once | INTRAVENOUS | Status: AC
  Administered 2017-07-27: 15 mg via INTRAVENOUS
  Filled 2017-07-27: qty 5

## 2017-07-27 MED ORDER — LINAGLIPTIN 5 MG PO TABS
5.0000 mg | ORAL_TABLET | Freq: Every day | ORAL | Status: DC
  Administered 2017-07-27 – 2017-07-28 (×2): 5 mg via ORAL
  Filled 2017-07-27 (×2): qty 1

## 2017-07-27 MED ORDER — ACETAMINOPHEN 325 MG PO TABS
650.0000 mg | ORAL_TABLET | Freq: Four times a day (QID) | ORAL | Status: DC | PRN
  Administered 2017-07-28: 650 mg via ORAL
  Filled 2017-07-27: qty 2

## 2017-07-27 MED ORDER — SODIUM CHLORIDE 0.9 % IV SOLN
2.0000 g | Freq: Two times a day (BID) | INTRAVENOUS | Status: DC
  Administered 2017-07-27 – 2017-07-28 (×2): 2 g via INTRAVENOUS
  Filled 2017-07-27 (×6): qty 2

## 2017-07-27 MED ORDER — VITAMIN B-12 1000 MCG PO TABS
5000.0000 ug | ORAL_TABLET | Freq: Every day | ORAL | Status: DC
  Administered 2017-07-27 – 2017-07-28 (×2): 5000 ug via ORAL
  Filled 2017-07-27 (×2): qty 5

## 2017-07-27 MED ORDER — ONDANSETRON HCL 4 MG PO TABS
4.0000 mg | ORAL_TABLET | Freq: Four times a day (QID) | ORAL | Status: DC | PRN
Start: 2017-07-27 — End: 2017-07-28

## 2017-07-27 MED ORDER — SODIUM CHLORIDE 0.9 % IV BOLUS
250.0000 mL | Freq: Once | INTRAVENOUS | Status: AC
  Administered 2017-07-27: 250 mL via INTRAVENOUS

## 2017-07-27 MED ORDER — ACETAMINOPHEN 650 MG RE SUPP: 650.0000 mg | Freq: Four times a day (QID) | RECTAL | Status: DC | PRN

## 2017-07-27 MED ORDER — MEMANTINE HCL 10 MG PO TABS
10.0000 mg | ORAL_TABLET | Freq: Every day | ORAL | Status: DC
  Administered 2017-07-27: 10 mg via ORAL
  Filled 2017-07-27 (×2): qty 1

## 2017-07-27 MED ORDER — HYDROCHLOROTHIAZIDE 25 MG PO TABS
25.0000 mg | ORAL_TABLET | Freq: Every morning | ORAL | Status: DC
  Administered 2017-07-27: 25 mg via ORAL
  Filled 2017-07-27: qty 1

## 2017-07-27 MED ORDER — SODIUM CHLORIDE 0.9 % IV SOLN: 250.0000 mL | INTRAVENOUS | Status: DC | PRN

## 2017-07-27 MED ORDER — ONDANSETRON HCL 4 MG/2ML IJ SOLN: 4.0000 mg | Freq: Four times a day (QID) | INTRAMUSCULAR | Status: DC | PRN

## 2017-07-27 MED ORDER — SENNOSIDES-DOCUSATE SODIUM 8.6-50 MG PO TABS: 1.0000 | ORAL_TABLET | Freq: Every evening | ORAL | Status: DC | PRN

## 2017-07-27 MED ORDER — GLIPIZIDE ER 10 MG PO TB24
10.0000 mg | ORAL_TABLET | Freq: Every day | ORAL | Status: DC | PRN
  Filled 2017-07-27: qty 1

## 2017-07-27 MED ORDER — SITAGLIPTIN PHOS-METFORMIN HCL 50-1000 MG PO TABS: 1.0000 | ORAL_TABLET | Freq: Two times a day (BID) | ORAL | Status: DC

## 2017-07-27 MED ORDER — LACTATED RINGERS IV BOLUS (SEPSIS)
500.0000 mL | Freq: Once | INTRAVENOUS | Status: AC
  Administered 2017-07-27: 500 mL via INTRAVENOUS
  Filled 2017-07-27: qty 500

## 2017-07-27 MED ORDER — ENOXAPARIN SODIUM 40 MG/0.4ML ~~LOC~~ SOLN: 40.0000 mg | SUBCUTANEOUS | Status: DC

## 2017-07-27 MED ORDER — ENOXAPARIN SODIUM 60 MG/0.6ML ~~LOC~~ SOLN
1.0000 mg/kg | Freq: Two times a day (BID) | SUBCUTANEOUS | Status: DC
  Administered 2017-07-27 – 2017-07-28 (×2): 60 mg via SUBCUTANEOUS
  Filled 2017-07-27 (×2): qty 0.6

## 2017-07-27 MED ORDER — DILTIAZEM HCL ER COATED BEADS 180 MG PO CP24
180.0000 mg | ORAL_CAPSULE | Freq: Every day | ORAL | Status: DC
  Administered 2017-07-27: 180 mg via ORAL
  Filled 2017-07-27: qty 1

## 2017-07-27 MED ORDER — METFORMIN HCL 500 MG PO TABS
1000.0000 mg | ORAL_TABLET | Freq: Two times a day (BID) | ORAL | Status: DC
  Administered 2017-07-27 – 2017-07-28 (×2): 1000 mg via ORAL
  Filled 2017-07-27 (×2): qty 2

## 2017-07-27 MED ORDER — ASPIRIN EC 81 MG PO TBEC
81.0000 mg | DELAYED_RELEASE_TABLET | Freq: Every day | ORAL | Status: DC
  Administered 2017-07-27 – 2017-07-28 (×2): 81 mg via ORAL
  Filled 2017-07-27 (×2): qty 1

## 2017-07-27 MED ORDER — VANCOMYCIN HCL IN DEXTROSE 1-5 GM/200ML-% IV SOLN
1000.0000 mg | Freq: Once | INTRAVENOUS | Status: AC
  Administered 2017-07-27: 1000 mg via INTRAVENOUS
  Filled 2017-07-27: qty 200

## 2017-07-27 MED ORDER — SODIUM CHLORIDE 0.9% FLUSH
3.0000 mL | Freq: Two times a day (BID) | INTRAVENOUS | Status: DC
  Administered 2017-07-27 – 2017-07-28 (×2): 3 mL via INTRAVENOUS

## 2017-07-27 MED ORDER — SODIUM CHLORIDE 0.9 % IV SOLN
2.0000 g | Freq: Once | INTRAVENOUS | Status: AC
  Administered 2017-07-27: 2 g via INTRAVENOUS
  Filled 2017-07-27: qty 2

## 2017-07-27 MED ORDER — PIOGLITAZONE HCL 15 MG PO TABS
15.0000 mg | ORAL_TABLET | Freq: Every day | ORAL | Status: DC
  Filled 2017-07-27: qty 1

## 2017-07-27 MED ORDER — METOPROLOL TARTRATE 5 MG/5ML IV SOLN
2.5000 mg | INTRAVENOUS | Status: DC | PRN
  Administered 2017-07-27: 2.5 mg via INTRAVENOUS
  Filled 2017-07-27: qty 5

## 2017-07-27 NOTE — Progress Notes (Signed)
ANTICOAGULATION CONSULT NOTE - Initial Consult  Pharmacy Consult for enoxaparin Indication: atrial fibrillation  Allergies  Allergen Reactions  . Captopril Other (See Comments)    Oral swelling    Patient Measurements: Height: 5\' 7"  (170.2 cm) Weight: 135 lb (61.2 kg) IBW/kg (Calculated) : 66.1 Heparin Dosing Weight:   Vital Signs: Temp: 97.9 F (36.6 C) (04/19 1457) Temp Source: Oral (04/19 1457) BP: 121/76 (04/19 1610) Pulse Rate: 67 (04/19 1610)  Labs: Recent Labs    07/27/17 0621  HGB 11.7*  HCT 35.4*  PLT 108*  LABPROT 14.2  INR 1.11  CREATININE 0.66  TROPONINI 0.03*    Estimated Creatinine Clearance: 48.9 mL/min (by C-G formula based on SCr of 0.66 mg/dL).   Medical History: Past Medical History:  Diagnosis Date  . Atrial fibrillation ([place])   . AVD (aortic valve disease)   . Carotid stenosis   . Dementia   . Diabetes mellitus without complication ([place])   . Glass eye   . Heart disease   . History of colonic polyps   . Hyperlipidemia   . Hypertension   . Memory loss   . TIA (transient ischemic attack)     Medications:  Infusions:  . sodium chloride    . ceFEPime (MAXIPIME) IV      Assessment: 89 yom cc cough/fall with PMH AF, carotid stenosis, dementia, DM2, HLD, HTN, TIA. ED post fall, CXR shows atelectasis/infiltrate and pleural effusion. Started Vanc/cefepime for HCAP. Pharmacy consulted to dose LMWH for AF.  Goal of Therapy:  Anti-Xa level 0.6-1 units/ml 4hrs after LMWH dose given Monitor platelets by anticoagulation protocol: Yes   Plan:  Lovenox 1 mg/kg subcutaneously Q12H  Laural Benes, Pharm.D., BCPS Clinical Pharmacist 07/27/2017,4:26 PM

## 2017-07-27 NOTE — ED Provider Notes (Addendum)
[facility] Emergency Department Provider Note  ____________________________________________   First MD Initiated Contact with Patient 07/27/17 [phone]     (approximate)  I have reviewed the triage vital signs and the nursing notes.   HISTORY  Chief Complaint Cough and Fall  Level 5 caveat:  history/ROS limited by chronic dementia  HPI John Werner is a 84 y.o. male with a history of advanced dementia and other medical problems as listed below who presents by EMS with concerns for sepsis.  His daughter reports that he has been more confused than usual just recently over the last few hours, he slid out of his wheelchair this evening, and he has been having a productive cough.  His heart rate has been over 100 and EMS reported that he was febrile although he was not febrile once he got to the emergency department.  He is reportedly not been vomiting.  Is questionable about whether he has had any difficulty breathing but he has been having a cough productive of green mucus recently.  No report of difficulty with urination although these details are not immediately available and the daughter is not yet at bedside.  Patient cannot provide any history.  Past Medical History:  Diagnosis Date  . Atrial fibrillation ([place])   . AVD (aortic valve disease)   . Carotid stenosis   . Dementia   . Diabetes mellitus without complication ([place])   . Glass eye   . Heart disease   . History of colonic polyps   . Hyperlipidemia   . Hypertension   . Memory loss   . TIA (transient ischemic attack)     Patient Active Problem List   Diagnosis Date Noted  . Diabetes mellitus without complication ([place]) 75/64/3329  . Hypertension 02/02/2017  . Carotid stenosis 02/02/2017    Past Surgical History:  Procedure Laterality Date  . CATARACT EXTRACTION Left     Prior to Admission medications   Medication Sig Start Date End Date Taking? Authorizing Provider  aspirin EC 81 MG tablet  Take by mouth.    [provider]  Cyanocobalamin (B-12) 5000 MCG SUBL Place under the tongue.    [provider]  diltiazem (CARDIZEM CD) 180 MG 24 hr capsule Take 180 mg by mouth. 01/18/11   [provider]  glipiZIDE (GLUCOTROL XL) 10 MG 24 hr tablet  02/01/17   [provider]  glucose blood (ONE TOUCH ULTRA TEST) test strip Use 2 (two) times daily. Diagnosis: E11.65 10/30/16   [provider]  hydrochlorothiazide (HYDRODIURIL) 25 MG tablet TAKE 1 TABLET EVERY MORNING 09/29/11   [provider]  LEVEMIR 100 UNIT/ML injection  02/01/17   [provider]  Multiple Vitamin (MULTI-VITAMINS) TABS Take by mouth. 06/14/09   [provider]  nitrofurantoin (MACRODANTIN) 50 MG capsule TAKE 1 CAPSULE (50 MG TOTAL) BY MOUTH NIGHTLY. 12/31/16   [provider]  pioglitazone (ACTOS) 30 MG tablet Take by mouth. 07/25/16   [provider]  ranitidine (ZANTAC) 150 MG capsule Take 1 capsule (150 mg total) by mouth 2 (two) times daily. Patient not taking: Reported on 02/02/2017 09/20/15   Carrie Mew, MD    Allergies Captopril  No family history on file.  Social History Social History   Tobacco Use  . Smoking status: Former Smoker    Types: Cigars  . Smokeless tobacco: Never Used  Substance Use Topics  . Alcohol use: No  . Drug use: No    Review of Systems  Level 5 caveat:  history/ROS limited by chronic dementia ____________________________________________   PHYSICAL EXAM:  VITAL SIGNS: ED Triage Vitals  Enc Vitals Group     BP 07/27/17 0613 (!) 150/76     Pulse Rate 07/27/17 0613 (!) 108     Resp 07/27/17 0613 16     Temp 07/27/17 0613 99.8 F (37.7 C)     Temp Source 07/27/17 0613 Rectal     SpO2 07/27/17 0613 97 %     Weight 07/27/17 0605 61.2 kg (135 lb)     Height --      Head Circumference --      Peak Flow --      Pain Score --      Pain Loc --      Pain Edu? --      Excl. in [place]?  --     Constitutional: Awake but somewhat somnolent.  Not able to provide any history.  Does respond to some stimuli.  Severely hard of hearing. Eyes:  false eye on right, left eye unremarkable Head: Atraumatic. Nose: No congestion/rhinnorhea. Mouth/Throat: Mucous membranes are moist. Neck: No stridor.  No meningeal signs.   Cardiovascular: Mild tachycardia, regular rhythm. Good peripheral circulation. Grossly normal heart sounds. Respiratory: Normal respiratory effort.  No retractions. Lungs CTAB. Gastrointestinal: Soft and nontender. No distention.  Musculoskeletal: No lower extremity tenderness nor edema. No gross deformities of extremities. Neurologic: Not able to participate a neurological exam but he is moving all 4 extremities.   Skin:  Skin is warm, dry and intact. No rash noted.   ____________________________________________   LABS (all labs ordered are listed, but only abnormal results are displayed)  Labs Reviewed  COMPREHENSIVE METABOLIC PANEL - Abnormal; Notable for the following components:      Result Value   Glucose, Bld 106 (*)    BUN 21 (*)    AST 53 (*)    All other components within normal limits  CBC WITH DIFFERENTIAL/PLATELET - Abnormal; Notable for the following components:   RBC 4.18 (*)    Hemoglobin 11.7 (*)    HCT 35.4 (*)    RDW 16.3 (*)    Platelets 108 (*)    Lymphs Abs 0.6 (*)    All other components within normal limits  URINALYSIS, COMPLETE (UACMP) WITH MICROSCOPIC - Abnormal; Notable for the following components:   Color, Urine YELLOW (*)    APPearance CLEAR (*)    Squamous Epithelial / LPF 0-5 (*)    All other components within normal limits  TROPONIN I - Abnormal; Notable for the following components:   Troponin I 0.03 (*)    All other components within normal limits  BRAIN NATRIURETIC PEPTIDE - Abnormal; Notable for the following components:   B Natriuretic Peptide 548.0 (*)    All other components within normal limits  CULTURE,  BLOOD (ROUTINE X 2)  CULTURE, BLOOD (ROUTINE X 2)  URINE CULTURE  LACTIC ACID, PLASMA  LIPASE, BLOOD  PROCALCITONIN  PROTIME-INR  LACTIC ACID, PLASMA   ____________________________________________  EKG  ED ECG REPORT I, Hinda Kehr, the attending physician, personally viewed and interpreted this ECG.  Date: 07/27/2017 EKG Time: 6:12 AM Rate: 109 Rhythm: Mild sinus tachycardia QRS Axis: normal Intervals: Incomplete right bundle branch block and left anterior fascicular block ST/T Wave abnormalities: Non-specific ST segment / T-wave changes, but no evidence of acute ischemia. Narrative Interpretation: no evidence of acute ischemia   ____________________________________________  RADIOLOGY I, Hinda Kehr, personally viewed and evaluated these  images (plain radiographs) as part of my medical decision making, as well as reviewing the written report by the radiologist.  ED MD interpretation: Questionable left lower lobe pneumonia versus atelectasis with small pleural effusion.  No acute abnormalities on CT head  Official radiology report(s): Dg Chest 2 View  Result Date: 07/27/2017 CLINICAL DATA:  Pain following fall.  Cough. EXAM: CHEST - 2 VIEW COMPARISON:  Chest radiograph and chest CT June 07, 2016 FINDINGS: There is mild atelectatic change in the left base with small left pleural effusion. No edema or consolidation. Heart is mildly enlarged with pulmonary vascularity within normal limits. No adenopathy. No pneumothorax. Bones are osteoporotic. IMPRESSION: Small left pleural effusion with mild left base atelectasis. Lungs elsewhere clear. Stable cardiac prominence. Bones diffusely osteoporotic. No pneumothorax. Electronically Signed   By: Lowella Grip III M.D.   On: 07/27/2017 07:01   Ct Head Wo Contrast  Result Date: 07/27/2017 CLINICAL DATA:  Pain following fall.  History of underlying dementia EXAM: CT HEAD WITHOUT CONTRAST TECHNIQUE: Contiguous axial images were  obtained from the base of the skull through the vertex without intravenous contrast. COMPARISON:  None. FINDINGS: Brain: There is moderate diffuse atrophy. There is no intracranial mass, hemorrhage, extra-axial fluid collection, or midline shift. There is fairly mild patchy small vessel disease in the centra semiovale bilaterally. There is no evident acute infarct. Vascular: There is no appreciable hyperdense vessel. There is calcification in each distal vertebral artery and carotid siphon region. Skull: Bony calvarium appears intact. Sinuses/Orbits: There is mucosal thickening in several ethmoid air cells bilaterally. There is mucosal thickening in the right sphenoid sinus. Other visualized paranasal sinuses are clear. 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 Visualized left orbit appears unremarkable. Other: Mastoid air cells are clear. IMPRESSION: Atrophy with mild patchy periventricular small vessel disease. No evident acute infarct. No mass or hemorrhage. Multiple foci of arterial vascular calcification noted. Mucosal thickening noted in several sinus regions. 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 Electronically Signed   By: Lowella Grip III M.D.   On: 07/27/2017 07:03    ____________________________________________   PROCEDURES  Critical Care performed: Yes, see critical care procedure note(s)   Procedure(s) performed:   .Critical Care Performed by: Hinda Kehr, MD Authorized by: Hinda Kehr, MD   Critical care provider statement:    Critical care time (minutes):  30   Critical care time was exclusive of:  Separately billable procedures and treating other patients   Critical care was necessary to treat or prevent imminent or life-threatening deterioration of the following conditions:  Sepsis   Critical care was time spent personally by me on the following activities:  Development of treatment plan with patient or surrogate, discussions with consultants, evaluation of  patient's response to treatment, examination of patient, obtaining history from patient or surrogate, ordering and performing treatments and interventions, ordering and review of laboratory studies, ordering and review of radiographic studies, pulse oximetry, re-evaluation of patient's condition and review of old charts     ____________________________________________   INITIAL IMPRESSION / [place] / ED COURSE  As part of my medical decision making, I reviewed the following data within the electronic MEDICAL RECORD NUMBER History obtained from family, Nursing notes reviewed and incorporated, Labs reviewed , EKG interpreted , Old chart reviewed, Radiograph reviewed , Discussed with PCP , Discussed with admitting physician  and Notes from prior ED visits    Differential diagnosis includes, but is not limited to, sepsis of unknown  source, CVA, viral infection, worsening dementia, delirium.  Initially there was concern for code sepsis but the patient had a rectal temp of 99.8 but he was tachycardiac.  I performed a standard sepsis work-up but without calling a code sepsis given that he did not quite meet criteria.  Lab work is generally reassuring; he has an unremarkable CBC and unremarkable CMP.  Lactic acid is within normal limits.  Blood cultures are pending.  Troponin is 0.03 which is nonspecific and nondiagnostic.  EKG does not show any acute abnormalities.  Chest x-ray is suggestive of atelectasis versus left lower lobe pneumonia with a small left pleural effusion.  This could be a source but it is uncertain at this time and the patient is not hypoxemic.  Urinalysis is pending.  Daughter has arrived so I will talk to her to obtain some additional collateral information.  Initial treatment included broad-spectrum antibiotics given his age and presentation (cefepime 2 g IV and vancomycin 1 g IV) as well as 1 L normal saline.  Clinical Course as of Jul 28 746  Fri Jul 27, 2017  0742 I  obtain additional collateral information from the daughter.  She said that he has been off of his baseline for about 24 hours.  His primary care provider is Dr. Doy Hutching and she called the clinic yesterday to see if she should start him on some clindamycin that they still had leftover from when he was treated for abdominal wall cellulitis not that long ago.  She reports that someone with whom she spoke told her to go ahead and start antibiotics.  During the night last night she found him on the floor, more confused than usual, not wearing closed, and she has been very worried about his persistent thick sounding cough and some increased difficulty breathing.Given everything she is describing I believe that he is developing a left lower lobe pneumonia and that is what we are seeing is a post atelectasis along with a small pleural effusion.  He is persistently tachycardic around 110 and even though he technically is not febrile, temperature of 99.8 rectal for a 84 year old does suggest an infectious process.I called and spoke by phone with Dr. Doy Hutching who is his primary care provider and discussed the case since he knows him the best.  I explained the clinical picture and he agreed that this is off baseline for the patient and that he would benefit from inpatient treatment, likely at least for a couple of days.  I have paged the hospitalist service to discuss and admit the patient.  He does not technically meet sepsis criteria at this time.   [CF]  0745 No evidence of urinary tract infection on UA.   [CF]  0747 Spoke by phone with Dr. Estanislado Pandy, explained situation, he will admit.   [CF]    Clinical Course User Index [CF] Hinda Kehr, MD    ____________________________________________  FINAL CLINICAL IMPRESSION(S) / ED DIAGNOSES  Final diagnoses:  Pneumonia of left lower lobe due to infectious organism [facility])  Pleural effusion, left  Delirium  Chronic dementia without behavioral disturbance  Cough      MEDICATIONS GIVEN DURING THIS VISIT:  Medications  vancomycin (VANCOCIN) IVPB 1000 mg/200 mL premix (1,000 mg Intravenous New Bag/Given 07/27/17 0735)  ceFEPIme (MAXIPIME) 2 g in sodium chloride 0.9 % 100 mL IVPB (0 g Intravenous Stopped 07/27/17 0737)  lactated ringers bolus 500 mL (0 mLs Intravenous Stopped 07/27/17 0737)     ED Discharge Orders  None       Note:  This document was prepared using Dragon voice recognition software and may include unintentional dictation errors.    Hinda Kehr, MD 07/27/17 John Werner    Hinda Kehr, MD 07/27/17 4562    Hinda Kehr, MD 08/06/17 [phone]

## 2017-07-27 NOTE — Consult Note (Signed)
Pharmacy Antibiotic Note  John Werner is a 84 y.o. male admitted on 07/27/2017 with pneumonia.  Pharmacy has been consulted for Cefepime and vancomycin dosing. Patient received vancomycin 1gm Iv and cefepime 2gm IV x1 dose in ED  Plan: Ke: 0.046   T1/2: 15.07   Vd: 42.8  Start Vancomycin 750 IV every 18 hours with 8 hour stack dosing.  Goal trough 15-20 mcg/mL. Calculated tough at Css 17. Trough level ordered prior to 4th dose.   MRSA PCR order- if negative recommend discontinuing Vancomycin.   Start Cefepime 2g IV every 12 hours based on CrCl 30-37ml/min.   Height: 5\' 7"  (170.2 cm) Weight: 135 lb (61.2 kg) IBW/kg (Calculated) : 66.1  Temp (24hrs), Avg:98.9 F (37.2 C), Min:97.9 F (36.6 C), Max:99.8 F (37.7 C)  Recent Labs  Lab 07/27/17 0621  WBC 6.4  CREATININE 0.66  LATICACIDVEN 1.3    Estimated Creatinine Clearance: 48.9 mL/min (by C-G formula based on SCr of 0.66 mg/dL).    Allergies  Allergen Reactions  . Captopril Other (See Comments)    Oral swelling    Antimicrobials this admission: 4/19 cefepime >>  4/19 vancomycin >>   Microbiology results: 4/18 BCx: pending 4/18  UCx: pending 4/18  MRSA PCR: pending   Thank you for allowing pharmacy to be a part of this patient's care.  Pernell Dupre, PharmD, BCPS Clinical Pharmacist 07/27/2017 11:56 AM

## 2017-07-27 NOTE — Progress Notes (Signed)
Notified by CCMD that patient's rhythm switched from NSR to A.Fib and HR is fluctuating between 130's-150's. MD notified. 15mg  of IV cardizem given with no change in patient's HR. MD notified. Orders received to transfer patient to 2A and start on cardizem drip. Patient in no distress, will continue to monitor. Ammie Dalton, RN

## 2017-07-27 NOTE — ED Notes (Signed)
Patient transported to X-ray 

## 2017-07-27 NOTE — H&P (Addendum)
[facility] [place] NAME: John Werner    MR#:  [id number]  DATE OF BIRTH:  15-Feb-1922  DATE OF ADMISSION:  07/27/2017  PRIMARY CARE PHYSICIAN: Idelle Crouch, MD   REQUESTING/REFERRING PHYSICIAN:   CHIEF COMPLAINT:   Chief Complaint  Patient presents with  . Cough  . Fall    HISTORY OF PRESENT ILLNESS: Reinhardt Licausi  is a 84 y.o. male with a known history of atrial fibrillation, carotid stenosis, dementia, diabetes mellitus type 2, hyperlipidemia, hypertension, transient ischemic attack.  Presented to the emergency room for cough, generalized weakness and had a fall.  Patient's daughter brought him to the emergency room.  He was found on the floor according to the daughter and patient also had some low-grade temperature of 99.8 F.  Patient has dementia and not a great historian.  Most of the information obtained from patient's daughter.  He was worked up with chest x-ray which showed atelectasis/infiltrate and a pleural effusion.  Patient was given IV antibiotics in the emergency room and hospitalist service was consulted.  He is awake and responds to verbal commands but not completely oriented to time place and person.  CT head no acute abnormality.  PAST MEDICAL HISTORY:   Past Medical History:  Diagnosis Date  . Atrial fibrillation ([place])   . AVD (aortic valve disease)   . Carotid stenosis   . Dementia   . Diabetes mellitus without complication ([place])   . Glass eye   . Heart disease   . History of colonic polyps   . Hyperlipidemia   . Hypertension   . Memory loss   . TIA (transient ischemic attack)     PAST SURGICAL HISTORY:  Past Surgical History:  Procedure Laterality Date  . CATARACT EXTRACTION Left     SOCIAL HISTORY:  Social History   Tobacco Use  . Smoking status: Former Smoker    Types: Cigars  . Smokeless tobacco: Never Used  Substance Use Topics  . Alcohol use: No    FAMILY HISTORY: History  reviewed. No pertinent family history.  DRUG ALLERGIES:  Allergies  Allergen Reactions  . Captopril Other (See Comments)    Oral swelling    REVIEW OF SYSTEMS:   Could not be obtained as patient has dementia  MEDICATIONS AT HOME:  Prior to Admission medications   Medication Sig Start Date End Date Taking? Authorizing Provider  acetaminophen (TYLENOL) 325 MG tablet Take 325 mg by mouth every 6 (six) hours as needed for mild pain or moderate pain.   Yes [provider]  aspirin EC 81 MG tablet Take 81 mg by mouth daily.    Yes [provider]  clindamycin (CLEOCIN) 150 MG capsule Take 150 mg by mouth 3 (three) times daily. 07/26/17  Yes [provider]  Cyanocobalamin 5000 MCG SUBL Place 5,000 mcg under the tongue daily.   Yes [provider]  diltiazem (CARDIZEM CD) 180 MG 24 hr capsule Take 180 mg by mouth daily.    Yes [provider]  DM-Doxylamine-Acetaminophen (VICKS NYQUIL COLD & FLU NIGHT) 15-6.25-325 MG/15ML LIQD Take 15 mLs by mouth as needed (for cough/sleep).   Yes [provider]  glipiZIDE (GLUCOTROL XL) 10 MG 24 hr tablet Take 10 mg by mouth daily as needed (if BS >100).    Yes [provider]  insulin detemir (LEVEMIR) 100 UNIT/ML injection Inject 20 Units into the skin daily.   Yes [provider]  memantine (NAMENDA) 10 MG tablet Take 10 mg by mouth at bedtime.   Yes [provider]  Multiple Vitamin (MULTI-VITAMINS) TABS Take 1 tablet by mouth daily.    Yes [provider]  nitrofurantoin (MACRODANTIN) 50 MG capsule Take 50 mg by mouth daily.   Yes [provider]  pioglitazone (ACTOS) 30 MG tablet Take 30 mg by mouth daily.    Yes [provider]  sitaGLIPtin-metformin (JANUMET) 50-1000 MG tablet Take 1 tablet by mouth 2 (two) times daily.   Yes [provider]      PHYSICAL EXAMINATION:   VITAL SIGNS: Blood pressure (!) 138/96, pulse (!) 113,  temperature 97.9 F (36.6 C), temperature source Axillary, resp. rate (!) 22, height 5\' 7"  (1.702 m), weight 61.2 kg (135 lb), SpO2 95 %.  GENERAL:  84 y.o.-year-old patient lying in the bed with no acute distress.  EYES: Pupils equal, round, reactive to light and accommodation. No scleral icterus. Extraocular muscles intact.  HEENT: Head atraumatic, normocephalic. Oropharynx and nasopharynx clear.  NECK:  Supple, no jugular venous distention. No thyroid enlargement, no tenderness.  LUNGS: Decreased breath sounds bilaterally, rales heard in left lung. No use of accessory muscles of respiration.  CARDIOVASCULAR: S1, S2 normal. No murmurs, rubs, or gallops.  ABDOMEN: Soft, nontender, nondistended. Bowel sounds present. No organomegaly or mass.  EXTREMITIES: No pedal edema, cyanosis, or clubbing.  NEUROLOGIC: Cranial nerves II through XII are intact. Muscle strength 5/5 in all extremities. Sensation intact. Gait not checked.  PSYCHIATRIC: The patient is alert and oriented x 1 Has dementia SKIN: No obvious rash, lesion, or ulcer.   LABORATORY PANEL:   CBC Recent Labs  Lab 07/27/17 0621  WBC 6.4  HGB 11.7*  HCT 35.4*  PLT 108*  MCV 84.7  MCH 28.0  MCHC 33.1  RDW 16.3*  LYMPHSABS 0.6*  MONOABS 0.6  EOSABS 0.0  BASOSABS 0.0   ------------------------------------------------------------------------------------------------------------------  Chemistries  Recent Labs  Lab 07/27/17 0621  NA 142  K 3.9  CL 107  CO2 26  GLUCOSE 106*  BUN 21*  CREATININE 0.66  CALCIUM 9.4  AST 53*  ALT 30  ALKPHOS 72  BILITOT 0.6   ------------------------------------------------------------------------------------------------------------------ estimated creatinine clearance is 48.9 mL/min (by C-G formula based on SCr of 0.66 mg/dL). ------------------------------------------------------------------------------------------------------------------ No results for input(s): TSH, T4TOTAL,  T3FREE, THYROIDAB in the last 72 hours.  Invalid input(s): FREET3   Coagulation profile Recent Labs  Lab 07/27/17 0621  INR 1.11   ------------------------------------------------------------------------------------------------------------------- No results for input(s): DDIMER in the last 72 hours. -------------------------------------------------------------------------------------------------------------------  Cardiac Enzymes Recent Labs  Lab 07/27/17 0621  TROPONINI 0.03*   ------------------------------------------------------------------------------------------------------------------ Invalid input(s): POCBNP  ---------------------------------------------------------------------------------------------------------------  Urinalysis    Component Value Date/Time   COLORURINE YELLOW (A) 07/27/2017 0705   APPEARANCEUR CLEAR (A) 07/27/2017 0705   LABSPEC 1.016 07/27/2017 0705   PHURINE 7.0 07/27/2017 0705   GLUCOSEU NEGATIVE 07/27/2017 0705   HGBUR NEGATIVE 07/27/2017 0705   BILIRUBINUR NEGATIVE 07/27/2017 0705   KETONESUR NEGATIVE 07/27/2017 0705   PROTEINUR NEGATIVE 07/27/2017 0705   NITRITE NEGATIVE 07/27/2017 0705   LEUKOCYTESUR NEGATIVE 07/27/2017 0705     RADIOLOGY: Dg Chest 2 View  Result Date: 07/27/2017 CLINICAL DATA:  Pain following fall.  Cough. EXAM: CHEST - 2 VIEW COMPARISON:  Chest radiograph and chest CT June 07, 2016 FINDINGS: There is mild atelectatic change in the left base with small left pleural effusion. No edema or consolidation. Heart is mildly enlarged with pulmonary vascularity within normal limits. No  adenopathy. No pneumothorax. Bones are osteoporotic. IMPRESSION: Small left pleural effusion with mild left base atelectasis. Lungs elsewhere clear. Stable cardiac prominence. Bones diffusely osteoporotic. No pneumothorax. Electronically Signed   By: Lowella Grip III M.D.   On: 07/27/2017 07:01   Ct Head Wo Contrast  Result Date:  07/27/2017 CLINICAL DATA:  Pain following fall.  History of underlying dementia EXAM: CT HEAD WITHOUT CONTRAST TECHNIQUE: Contiguous axial images were obtained from the base of the skull through the vertex without intravenous contrast. COMPARISON:  None. FINDINGS: Brain: There is moderate diffuse atrophy. There is no intracranial mass, hemorrhage, extra-axial fluid collection, or midline shift. There is fairly mild patchy small vessel disease in the centra semiovale bilaterally. There is no evident acute infarct. Vascular: There is no appreciable hyperdense vessel. There is calcification in each distal vertebral artery and carotid siphon region. Skull: Bony calvarium appears intact. Sinuses/Orbits: There is mucosal thickening in several ethmoid air cells bilaterally. There is mucosal thickening in the right sphenoid sinus. Other visualized paranasal sinuses are clear. 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 Visualized left orbit appears unremarkable. Other: Mastoid air cells are clear. IMPRESSION: Atrophy with mild patchy periventricular small vessel disease. No evident acute infarct. No mass or hemorrhage. Multiple foci of arterial vascular calcification noted. Mucosal thickening noted in several sinus regions. 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 Electronically Signed   By: Lowella Grip III M.D.   On: 07/27/2017 07:03    EKG: Orders placed or performed during the hospital encounter of 07/27/17  . EKG 12-Lead  . EKG 12-Lead  . EKG 12-Lead  . EKG 12-Lead    IMPRESSION AND PLAN:  84 year old elderly male patient with history of diabetes mellitus type 2, atrial fibrillation, advanced dementia, hyperlipidemia, hypertension presented to the emergency room with fall and cough low-grade fever.  1.  Healthcare associated pneumonia Start patient on IV vancomycin and cefepime antibiotic Check pro calcitonin Check MRSA PCR  2.  Pleural effusion Could be from pneumonia Check echocardiogram to  assess LV function Lasix if needed for diuresis based on volume status  3.  Gait instability Physical therapy evaluation  4.  Advanced dementia Supportive care  5.  Type 2 diabetes mellitus Continue Janumet orally and sliding scale coverage with insulin  6. Afib with rapid rate Move patient to telemetry IV cardizem drip Echocardiogram Cardiology consult   All the records are reviewed and case discussed with ED provider. Management plans discussed with the patient, family and they are in agreement.  CODE STATUS:Full code    Code Status Orders  (From admission, onward)        Start     Ordered   07/27/17 1339  Do not attempt resuscitation (DNR)  Continuous    Question Answer Comment  In the event of cardiac or respiratory ARREST Do not call a "code blue"   In the event of cardiac or respiratory ARREST Do not perform Intubation, CPR, defibrillation or ACLS   In the event of cardiac or respiratory ARREST Use medication by any route, position, wound care, and other measures to relive pain and suffering. May use oxygen, suction and manual treatment of airway obstruction as needed for comfort.      07/27/17 1338    Code Status History    Date Active Date Inactive Code Status Order ID Comments User Context   07/27/2017 1114 07/27/2017 1338 Full Code [id number]  Saundra Shelling, MD Inpatient    Advance Directive Documentation  Most Recent Value  Type of Advance Directive  Healthcare Power of Attorney, Out of facility DNR (pink MOST or yellow form)  Pre-existing out of facility DNR order (yellow form or pink MOST form)  -  "MOST" Form in Place?  -       TOTAL TIME TAKING CARE OF THIS PATIENT: 52 minutes.    Saundra Shelling M.D on 07/27/2017 at 1:40 PM  Between 7am to 6pm - Pager - [phone]  After 6pm go to [email or web address] - password [email or web address] [facility]  Office  [phone]  CC: Primary care physician; Idelle Crouch, MD
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
## 2017-07-27 NOTE — Progress Notes (Signed)
Pt transferred from 1C, Report received from [facility], [place]. No orders for cardizem gtt, VS on arrival bp 100/70, HR 126. Dr. Viann Shove notified and orders received for 250 ml N/S bolus and IV metropolol PRN. Will continue to monitor and pass on to oncoming nurse.

## 2017-07-27 NOTE — Progress Notes (Addendum)
Advanced care plan.  Purpose of the Encounter: CODE STATUS  Parties in Attendance:Patient and family  Patient's Decision Capacity: Has dementia  Subjective/Patient's story: Elderly patient presented with cough, fever and fall  Objective/Medical story Admitted for pneumonia and gait instability   Goals of care determination:  Advanced directives discussed with the patient Patient's family wants  cardiac resuscitation, intubation and ventilator if need arise  CODE STATUS: Full code  Time spent discussing advanced care planning: 16 minutes

## 2017-07-27 NOTE — ED Triage Notes (Signed)
Patient coming EMS from assisted living at [facility]. Patient has dementia at baseline, hx HOH, diabetic, hypertension. Patient had witnessed fall, slid from chair, denies pain, nontender to palpation. Patient has productive cough with green sputum

## 2017-07-27 NOTE — ED Notes (Signed)
Attempted to call report to floor.  Floor will call back for report

## 2017-07-28 ENCOUNTER — Inpatient Hospital Stay: Admit: 2017-07-28 | Payer: Medicare HMO

## 2017-07-28 LAB — BASIC METABOLIC PANEL
ANION GAP: 5 (ref 5–15)
BUN: 20 mg/dL (ref 6–20)
CALCIUM: 8.6 mg/dL — AB (ref 8.9–10.3)
CO2: 28 mmol/L (ref 22–32)
Chloride: 108 mmol/L (ref 101–111)
Creatinine, Ser: 0.72 mg/dL (ref 0.61–1.24)
Glucose, Bld: 110 mg/dL — ABNORMAL HIGH (ref 65–99)
Potassium: 3.5 mmol/L (ref 3.5–5.1)
SODIUM: 141 mmol/L (ref 135–145)

## 2017-07-28 LAB — URINE CULTURE: CULTURE: NO GROWTH

## 2017-07-28 LAB — CBC
HEMATOCRIT: 30.5 % — AB (ref 40.0–52.0)
Hemoglobin: 10.1 g/dL — ABNORMAL LOW (ref 13.0–18.0)
MCH: 28.4 pg (ref 26.0–34.0)
MCHC: 33 g/dL (ref 32.0–36.0)
MCV: 86.1 fL (ref 80.0–100.0)
PLATELETS: 96 10*3/uL — AB (ref 150–440)
RBC: 3.55 MIL/uL — ABNORMAL LOW (ref 4.40–5.90)
RDW: 16.6 % — AB (ref 11.5–14.5)
WBC: 4 10*3/uL (ref 3.8–10.6)

## 2017-07-28 MED ORDER — DILTIAZEM HCL ER COATED BEADS 120 MG PO CP24
120.0000 mg | ORAL_CAPSULE | Freq: Every day | ORAL | Status: DC
  Administered 2017-07-28: 120 mg via ORAL
  Filled 2017-07-28: qty 1

## 2017-07-28 MED ORDER — ENOXAPARIN SODIUM 40 MG/0.4ML ~~LOC~~ SOLN: 40.0000 mg | SUBCUTANEOUS | Status: DC

## 2017-07-28 MED ORDER — AZITHROMYCIN 250 MG PO TABS: ORAL_TABLET | ORAL | 0 refills | Status: AC

## 2017-07-28 NOTE — Progress Notes (Signed)
Claretha Cooper to be D/C'd Home with out patient PT per MD order.  Discussed prescriptions and follow up appointments with the patient and daughter. Prescriptions given to patient, medication list explained in detail. Pt verbalized understanding.  Allergies as of 07/28/2017      Reactions   Captopril Other (See Comments)   Oral swelling      Medication List    STOP taking these medications   nitrofurantoin 50 MG capsule Commonly known as:  MACRODANTIN     TAKE these medications   acetaminophen 325 MG tablet Commonly known as:  TYLENOL Take 325 mg by mouth every 6 (six) hours as needed for mild pain or moderate pain.   aspirin EC 81 MG tablet Take 81 mg by mouth daily.   azithromycin 250 MG tablet Commonly known as:  ZITHROMAX Z-PAK Take 2 tablets (500 mg) on  Day 1,  followed by 1 tablet (250 mg) once daily on Days 2 through 5.   clindamycin 150 MG capsule Commonly known as:  CLEOCIN Take 150 mg by mouth 3 (three) times daily.   Cyanocobalamin 5000 MCG Subl Place 5,000 mcg under the tongue daily.   diltiazem 180 MG 24 hr capsule Commonly known as:  CARDIZEM CD Take 180 mg by mouth daily.   glipiZIDE 10 MG 24 hr tablet Commonly known as:  GLUCOTROL XL Take 10 mg by mouth daily as needed (if BS >100).   insulin detemir 100 UNIT/ML injection Commonly known as:  LEVEMIR Inject 20 Units into the skin daily.   memantine 10 MG tablet Commonly known as:  NAMENDA Take 10 mg by mouth at bedtime.   MULTI-VITAMINS Tabs Take 1 tablet by mouth daily.   pioglitazone 30 MG tablet Commonly known as:  ACTOS Take 30 mg by mouth daily.   sitaGLIPtin-metformin 50-1000 MG tablet Commonly known as:  JANUMET Take 1 tablet by mouth 2 (two) times daily.   VICKS NYQUIL COLD & FLU NIGHT 15-6.25-325 MG/15ML Liqd Generic drug:  DM-Doxylamine-Acetaminophen Take 15 mLs by mouth as needed (for cough/sleep).       Vitals:   07/28/17 0829 07/28/17 1151  BP: (!) 106/58 118/67  Pulse:  74 77  Resp: 18 18  Temp: 98.4 F (36.9 C) 98.4 F (36.9 C)  SpO2: 92% 90%    Tele box removed and returned. Skin clean, dry and intact without evidence of skin break down, no evidence of skin tears noted. IV catheter discontinued intact. Site without signs and symptoms of complications. Dressing and pressure applied. Pt denies pain at this time. No complaints noted.  An After Visit Summary was printed and given to the patient. Patient escorted via [facility], and D/C home via private auto.  Rolley Sims

## 2017-07-28 NOTE — Discharge Summary (Signed)
[facility] NAME: John Werner    MR#:  [id number]  DATE OF BIRTH:  [birth year]  DATE OF ADMISSION:  07/27/2017 ADMITTING PHYSICIAN: Saundra Shelling, MD  DATE OF DISCHARGE: No discharge date for patient encounter.  PRIMARY CARE PHYSICIAN: Idelle Crouch, MD   ADMISSION DIAGNOSIS:  1.  Pneumonia 2.  Accidental fall 3.  Gait instability 4.Advanced dementia 5.Atrial fibrillation  6.Diabetes mellitus type 2  DISCHARGE DIAGNOSIS:  1.  Acute bronchitis 2 accidental fall. 3. Atrial fibrillation 4.  Diabetes mellitus type 5 advanced dementia. SECONDARY DIAGNOSIS:   Past Medical History:  Diagnosis Date  . Atrial fibrillation ([place])   . AVD (aortic valve disease)   . Carotid stenosis   . Dementia   . Diabetes mellitus without complication ([place])   . Glass eye   . Heart disease   . History of colonic polyps   . Hyperlipidemia   . Hypertension   . Memory loss   . TIA (transient ischemic attack)      ADMITTING HISTORY John Werner  is a 84 y.o. male with a known history of atrial fibrillation, carotid stenosis, dementia, diabetes mellitus type 2, hyperlipidemia, hypertension, transient ischemic attack.  Presented to the emergency room for cough, generalized weakness and had a fall.  Patient's daughter brought him to the emergency room.  He was found on the floor according to the daughter and patient also had some low-grade temperature of 99.8 F.  Patient has dementia and not a great historian.  Most of the information obtained from patient's daughter.  He was worked up with chest x-ray which showed atelectasis/infiltrate and a pleural effusion.  Patient was given IV antibiotics in the emergency room and hospitalist service was consulted.  He is awake and responds to verbal commands but not completely oriented to time place and person.  CT head no acute abnormality   HOSPITAL COURSE:  84 year old elderly male patient was admitted  to the medical floor with cardiac monitoring.  Patient was initially started on IV vancomycin and cefepime antibiotics, cultures did not reveal any growth.  Pro-calcitonin less than 0.1. chest x-ray showed atelectasis with effusion. He had atrial fibrillation with rapid rate during hospitalization.  Was moved to telemetry and heart rate was controlled with IV as needed metoprolol and oral Cardizem. cardiology consultation was done in the hospitalization anticoagulation was deferred in view of her advanced age and dementia.  Heart rate was well controlled with oral Cardizem.  Patient will be discharged home follow-up with primary care physician in the clinic.   CONSULTS OBTAINED:  Treatment Team:  Corey Skains, MD  DRUG ALLERGIES:   Allergies  Allergen Reactions  . Captopril Other (See Comments)    Oral swelling    DISCHARGE MEDICATIONS:   Allergies as of 07/28/2017      Reactions   Captopril Other (See Comments)   Oral swelling      Medication List    STOP taking these medications   nitrofurantoin 50 MG capsule Commonly known as:  MACRODANTIN     TAKE these medications   acetaminophen 325 MG tablet Commonly known as:  TYLENOL Take 325 mg by mouth every 6 (six) hours as needed for mild pain or moderate pain.   aspirin EC 81 MG tablet Take 81 mg by mouth daily.   azithromycin 250 MG tablet Commonly known as:  ZITHROMAX Z-PAK Take 2 tablets (500 mg) on  Day 1,  followed by 1  tablet (250 mg) once daily on Days 2 through 5.   clindamycin 150 MG capsule Commonly known as:  CLEOCIN Take 150 mg by mouth 3 (three) times daily.   Cyanocobalamin 5000 MCG Subl Place 5,000 mcg under the tongue daily.   diltiazem 180 MG 24 hr capsule Commonly known as:  CARDIZEM CD Take 180 mg by mouth daily.   glipiZIDE 10 MG 24 hr tablet Commonly known as:  GLUCOTROL XL Take 10 mg by mouth daily as needed (if BS >100).   insulin detemir 100 UNIT/ML injection Commonly known as:   LEVEMIR Inject 20 Units into the skin daily.   memantine 10 MG tablet Commonly known as:  NAMENDA Take 10 mg by mouth at bedtime.   MULTI-VITAMINS Tabs Take 1 tablet by mouth daily.   pioglitazone 30 MG tablet Commonly known as:  ACTOS Take 30 mg by mouth daily.   sitaGLIPtin-metformin 50-1000 MG tablet Commonly known as:  JANUMET Take 1 tablet by mouth 2 (two) times daily.   VICKS NYQUIL COLD & FLU NIGHT 15-6.25-325 MG/15ML Liqd Generic drug:  DM-Doxylamine-Acetaminophen Take 15 mLs by mouth as needed (for cough/sleep).       Today  Patient seen and evaluated today No chest pain No palpitations  no shortness of breath   VITAL SIGNS:  Blood pressure 118/67, pulse 77, temperature 98.4 F (36.9 C), temperature source Oral, resp. rate 18, height 5\' 7"  (1.702 m), weight 61.2 kg (135 lb), SpO2 90 %.  I/O:    Intake/Output Summary (Last 24 hours) at 07/28/2017 1425 Last data filed at 07/28/2017 1356 Gross per 24 hour  Intake 580 ml  Output 450 ml  Net 130 ml    PHYSICAL EXAMINATION:  Physical Exam  GENERAL:  84 y.o.-year-old patient lying in the bed with no acute distress.  LUNGS: Normal breath sounds bilaterally, no wheezing, rales,rhonchi or crepitation. No use of accessory muscles of respiration.  CARDIOVASCULAR: S1, S2 irregular. No murmurs, rubs, or gallops.  ABDOMEN: Soft, non-tender, non-distended. Bowel sounds present. No organomegaly or mass.  NEUROLOGIC: Moves all 4 extremities. PSYCHIATRIC: The patient is alert and oriented x 3.  SKIN: No obvious rash, lesion, or ulcer.   DATA REVIEW:   CBC Recent Labs  Lab 07/28/17 0500  WBC 4.0  HGB 10.1*  HCT 30.5*  PLT 96*    Chemistries  Recent Labs  Lab 07/27/17 0621 07/28/17 0500  NA 142 141  K 3.9 3.5  CL 107 108  CO2 26 28  GLUCOSE 106* 110*  BUN 21* 20  CREATININE 0.66 0.72  CALCIUM 9.4 8.6*  AST 53*  --   ALT 30  --   ALKPHOS 72  --   BILITOT 0.6  --     Cardiac Enzymes Recent  Labs  Lab 07/27/17 0621  TROPONINI 0.03*    Microbiology Results  Results for orders placed or performed during the hospital encounter of 07/27/17  Blood culture (routine x 2)     Status: None (Preliminary result)   Collection Time: 07/27/17  6:21 AM  Result Value Ref Range Status   Specimen Description BLOOD LEFT FA  Final   Special Requests   Final    BOTTLES DRAWN AEROBIC AND ANAEROBIC Blood Culture adequate volume   Culture   Final    NO GROWTH 1 DAY Performed at [facility], [address] [place], [place] [address]    Report Status PENDING  Incomplete  Blood culture (routine x 2)  Status: None (Preliminary result)   Collection Time: 07/27/17  6:21 AM  Result Value Ref Range Status   Specimen Description BLOOD RIGHT [facility]  Final   Special Requests   Final    BOTTLES DRAWN AEROBIC AND ANAEROBIC Blood Culture adequate volume   Culture   Final    NO GROWTH 1 DAY Performed at [facility], [address], [place], [place] [address]    Report Status PENDING  Incomplete  Urine culture     Status: None   Collection Time: 07/27/17  7:05 AM  Result Value Ref Range Status   Specimen Description   Final    URINE, RANDOM Performed at [facility], [address], [place], [place] [address]    Special Requests   Final    NONE Performed at [facility], [address], [place], [place] [address]    Culture   Final    NO GROWTH Performed at [facility], [place] [address], [place], [place] [address]    Report Status 07/28/2017 FINAL  Final  MRSA PCR Screening     Status: None   Collection Time: 07/27/17 12:12 PM  Result Value Ref Range Status   MRSA by PCR NEGATIVE NEGATIVE Final    Comment:        The GeneXpert MRSA Assay (FDA approved for NASAL specimens only), is one component of a comprehensive MRSA colonization surveillance program. It is not intended to diagnose MRSA infection nor to guide or monitor treatment  for MRSA infections. Performed at [facility], [address], [place], [place] [address]     RADIOLOGY:  Dg Chest 2 View  Result Date: 07/27/2017 CLINICAL DATA:  Pain following fall.  Cough. EXAM: CHEST - 2 VIEW COMPARISON:  Chest radiograph and chest CT June 07, 2016 FINDINGS: There is mild atelectatic change in the left base with small left pleural effusion. No edema or consolidation. Heart is mildly enlarged with pulmonary vascularity within normal limits. No adenopathy. No pneumothorax. Bones are osteoporotic. IMPRESSION: Small left pleural effusion with mild left base atelectasis. Lungs elsewhere clear. Stable cardiac prominence. Bones diffusely osteoporotic. No pneumothorax. Electronically Signed   By: Lowella Grip III M.D.   On: 07/27/2017 07:01   Ct Head Wo Contrast  Result Date: 07/27/2017 CLINICAL DATA:  Pain following fall.  History of underlying dementia EXAM: CT HEAD WITHOUT CONTRAST TECHNIQUE: Contiguous axial images were obtained from the base of the skull through the vertex without intravenous contrast. COMPARISON:  None. FINDINGS: Brain: There is moderate diffuse atrophy. There is no intracranial mass, hemorrhage, extra-axial fluid collection, or midline shift. There is fairly mild patchy small vessel disease in the centra semiovale bilaterally. There is no evident acute infarct. Vascular: There is no appreciable hyperdense vessel. There is calcification in each distal vertebral artery and carotid siphon region. Skull: Bony calvarium appears intact. Sinuses/Orbits: There is mucosal thickening in several ethmoid air cells bilaterally. There is mucosal thickening in the right sphenoid sinus. Other visualized paranasal sinuses are clear. 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 Visualized left orbit appears unremarkable. Other: Mastoid air cells are clear. IMPRESSION: Atrophy with mild patchy periventricular small vessel disease. No evident acute infarct. No mass or  hemorrhage. Multiple foci of arterial vascular calcification noted. Mucosal thickening noted in several sinus regions. 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 Electronically Signed   By: Lowella Grip III M.D.   On: 07/27/2017 07:03    Follow up with  PCP in 1 week.  Management plans discussed with the patient, family and they are in agreement.  CODE STATUS: Full code    Code Status Orders  (From admission, onward)        Start     Ordered   07/27/17 1436  Full code  Continuous     07/27/17 1435    Code Status History    Date Active Date Inactive Code Status Order ID Comments User Context   07/27/2017 1338 07/27/2017 1435 DNR [id number]  Saundra Shelling, MD Inpatient   07/27/2017 1114 07/27/2017 1338 Full Code [id number]  Saundra Shelling, MD Inpatient    Advance Directive Documentation     Most Recent Value  Type of Advance Directive  Healthcare Power of [place], Out of facility DNR (pink MOST or yellow form)  Pre-existing out of facility DNR order (yellow form or pink MOST form)  -  "MOST" Form in Place?  -      TOTAL TIME TAKING CARE OF THIS PATIENT ON DAY OF DISCHARGE: more than 34 minutes.   Saundra Shelling M.D on 07/28/2017 at 2:25 PM  Between 7am to 6pm - Pager - [phone]  After 6pm go to [email or web address] - password [email or web address] [facility]  Office  [phone]  CC: Primary care physician; Idelle Crouch, MD  Note: This dictation was prepared with Dragon dictation along with smaller phrase technology. Any transcriptional errors that result from this process are unintentional.

## 2017-07-28 NOTE — Evaluation (Signed)
Physical Therapy Evaluation Patient Details Name: John Werner MRN: [id number] DOB: [birth year] Today's Date: 07/28/2017   History of Present Illness  84 yo male with onset of PNA and having recent fall, weakness and atelectasis, pleural effusion.  PMHx:  dementia, osteoporosis, stenosis carotid arteries, HTN, TIA, DM, aortic valve disease, a-fib  Clinical Impression  Pt is up to walk with daughter encouraging, and then discussed his gait correction with RW to increase use of RLE.  Pt has dementia, but is able to follow instructions despite impulsive turning and mobility with RW.  Follow acutely for strengthening and balance as tolerated, and will transition to outpatient therapy when discharged.  Pt is quite agreeable about doing therapy and daughter is hoping to increase his safety and independence with mobility. Has access to on-campus therapy at his ALF.    Follow Up Recommendations Outpatient PT    Equipment Recommendations  None recommended by PT    Recommendations for Other Services       Precautions / Restrictions Precautions Precautions: Fall(telemetry) Restrictions Weight Bearing Restrictions: No      Mobility  Bed Mobility Overal bed mobility: Needs Assistance Bed Mobility: Supine to Sit;Sit to Supine     Supine to sit: Min assist Sit to supine: Supervision;Min guard   General bed mobility comments: assisted to raise trunk off bed  Transfers Overall transfer level: Needs assistance Equipment used: Rolling walker (2 wheeled);1 person hand held assist Transfers: Sit to/from Stand Sit to Stand: Min guard;Min assist         General transfer comment: assisted to power up and then pt could steady with PT  Ambulation/Gait Ambulation/Gait assistance: Min guard Ambulation Distance (Feet): 200 Feet(160 =40) Assistive device: Rolling walker (2 wheeled);1 person hand held assist Gait Pattern/deviations: Step-to pattern;Step-through pattern;Decreased stride  length;Wide base of support Gait velocity: reduced Gait velocity interpretation: <1.31 ft/sec, indicative of household ambulator General Gait Details: pt is walking heel to toe with walker but with HHA is reducing contact on RLE  Stairs            Wheelchair Mobility    Modified Rankin (Stroke Patients Only)       Balance Overall balance assessment: History of Falls;Needs assistance Sitting-balance support: Feet supported Sitting balance-Leahy Scale: Good     Standing balance support: Bilateral upper extremity supported;During functional activity Standing balance-Leahy Scale: Fair                               Pertinent Vitals/Pain Pain Assessment: Faces Faces Pain Scale: Hurts a little bit Pain Location: R hip OA Pain Intervention(s): Limited activity within patient's tolerance;Monitored during session(used AD to unload R hip)    Home Living Family/patient expects to be discharged to:: Private residence Living Arrangements: Children Available Help at Discharge: Family;Available 24 hours/day Type of Home: Apartment Home Access: Level entry     Home Layout: One level Home Equipment: Walker - 2 wheels;Cane - single point;Toilet riser;Grab bars - toilet;Grab bars - tub/shower;Shower seat - built in      Prior Function Level of Independence: Needs assistance   Gait / Transfers Assistance Needed: gait with no assistance with or without spc or walker  ADL's / Homemaking Assistance Needed: daughter to assist house work, has meals on campus of ALF        Hand Dominance   Dominant Hand: Right    Extremity/Trunk Assessment   Upper Extremity Assessment Upper Extremity Assessment: Overall WFL for  tasks assessed    Lower Extremity Assessment Lower Extremity Assessment: Generalized weakness    Cervical / Trunk Assessment Cervical / Trunk Assessment: Kyphotic  Communication   Communication: HOH(requires very close speaking to his ear)  Cognition  Arousal/Alertness: Awake/alert Behavior During Therapy: Impulsive Overall Cognitive Status: History of cognitive impairments - at baseline                                        General Comments      Exercises     Assessment/Plan    PT Assessment Patient needs continued PT services  PT Problem List Decreased strength;Decreased range of motion;Decreased activity tolerance;Decreased balance;Decreased mobility;Decreased coordination;Decreased cognition;Decreased knowledge of use of DME;Decreased safety awareness;Cardiopulmonary status limiting activity;Pain       PT Treatment Interventions DME instruction;Gait training;Functional mobility training;Therapeutic activities;Therapeutic exercise;Balance training;Neuromuscular re-education;Patient/family education    PT Goals (Current goals can be found in the Care Plan section)  Acute Rehab PT Goals Patient Stated Goal: none stated PT Goal Formulation: With family Time For Goal Achievement: 08/11/17 Potential to Achieve Goals: Good    Frequency Min 2X/week   Barriers to discharge Other (comment)(ongoing pain with reduced use of R hip and leg) daughter will be able to assist with his care    Co-evaluation               AM-PAC PT "6 Clicks" Daily Activity  Outcome Measure Difficulty turning over in bed (including adjusting bedclothes, sheets and blankets)?: A Little Difficulty moving from lying on back to sitting on the side of the bed? : A Little Difficulty sitting down on and standing up from a chair with arms (e.g., wheelchair, bedside commode, etc,.)?: Unable Help needed moving to and from a bed to chair (including a wheelchair)?: A Little Help needed walking in hospital room?: A Little Help needed climbing 3-5 steps with a railing? : A Lot 6 Click Score: 15    End of Session Equipment Utilized During Treatment: Gait belt Activity Tolerance: No increased pain Patient left: in bed;with call bell/phone  within reach;with bed alarm set;with family/visitor present Nurse Communication: Mobility status PT Visit Diagnosis: Unsteadiness on feet (R26.81);Muscle weakness (generalized) (M62.81);Pain Pain - Right/Left: Right Pain - part of body: Hip    Time: 7425-9563 PT Time Calculation (min) (ACUTE ONLY): 25 min   Charges:   PT Evaluation $PT Eval Moderate Complexity: 1 Mod PT Treatments $Gait Training: 8-22 mins   PT G Codes:   PT G-Codes **NOT FOR INPATIENT CLASS** Functional Assessment Tool Used: AM-PAC 6 Clicks Basic Mobility    Ramond Dial 07/28/2017, 3:01 PM   Mee Hives, PT MS Acute Rehab Dept. Number: [place] [facility] [place]

## 2017-07-28 NOTE — Consult Note (Signed)
[facility] Consultation Note  Patient ID: John Werner, MRN: [id number], DOB/AGE: 11-02-1921 84 y.o. Admit date: 07/27/2017   Date of Consult: 07/28/2017 Primary Physician: Idelle Crouch, MD Primary Cardiologist: None  Chief Complaint:  Chief Complaint  Patient presents with  . Cough  . Fall   Reason for Consult: Bronchitis  HPI: 84 y.o. male with known paroxysmal nonvalvular atrial fibrillation essential hypertension mixed hyperlipidemia history of location now with dementia but still living at home.  The patient has had new onset of shortness of breath and cough congestion for which the patient has had a change in his mental status likely secondary to infection and/or bronchitis.  There is no current evidence of myocardial infarction or significant congestive heart failure although there is some elevation of BNP consistent with some concerns.  The patient had some tachycardia when admitted to the hospital but EKG showed normal sinus rhythm at this time and sinus tachycardia with admission.  There is been no evidence of atrial fibrillation.  The patient previously had not used anticoagulation due to its cumbersome concerns of bleeding risk fall risk and dementia.  Currently would suggest continued heart rate control with previous outpatient use of diltiazem and use anticoagulation if patient has true paroxysmal atrial fibrillation at this time.  The patient has had a abstinence of anticoagulation due to maintenance of normal rhythm and concerns of fall risk and dementia  Past Medical History:  Diagnosis Date  . Atrial fibrillation ([place])   . AVD (aortic valve disease)   . Carotid stenosis   . Dementia   . Diabetes mellitus without complication ([place])   . Glass eye   . Heart disease   . History of colonic polyps   . Hyperlipidemia   . Hypertension   . Memory loss   . TIA (transient ischemic attack)       Surgical History:  Past Surgical History:  Procedure  Laterality Date  . CATARACT EXTRACTION Left      Home Meds: Prior to Admission medications   Medication Sig Start Date End Date Taking? Authorizing Provider  acetaminophen (TYLENOL) 325 MG tablet Take 325 mg by mouth every 6 (six) hours as needed for mild pain or moderate pain.   Yes [provider]  aspirin EC 81 MG tablet Take 81 mg by mouth daily.    Yes [provider]  clindamycin (CLEOCIN) 150 MG capsule Take 150 mg by mouth 3 (three) times daily. 07/26/17  Yes [provider]  Cyanocobalamin 5000 MCG SUBL Place 5,000 mcg under the tongue daily.   Yes [provider]  diltiazem (CARDIZEM CD) 180 MG 24 hr capsule Take 180 mg by mouth daily.    Yes [provider]  DM-Doxylamine-Acetaminophen (VICKS NYQUIL COLD & FLU NIGHT) 15-6.25-325 MG/15ML LIQD Take 15 mLs by mouth as needed (for cough/sleep).   Yes [provider]  glipiZIDE (GLUCOTROL XL) 10 MG 24 hr tablet Take 10 mg by mouth daily as needed (if BS >100).    Yes [provider]  insulin detemir (LEVEMIR) 100 UNIT/ML injection Inject 20 Units into the skin daily.   Yes [provider]  memantine (NAMENDA) 10 MG tablet Take 10 mg by mouth at bedtime.   Yes [provider]  Multiple Vitamin (MULTI-VITAMINS) TABS Take 1 tablet by mouth daily.    Yes [provider]  nitrofurantoin (MACRODANTIN) 50 MG capsule Take 50 mg by mouth daily.   Yes [provider]  pioglitazone (ACTOS)  30 MG tablet Take 30 mg by mouth daily.    Yes [provider]  sitaGLIPtin-metformin (JANUMET) 50-1000 MG tablet Take 1 tablet by mouth 2 (two) times daily.   Yes [provider]    Inpatient Medications:  . aspirin EC  81 mg Oral Daily  . enoxaparin (LOVENOX) injection  1 mg/kg Subcutaneous Q12H  . linagliptin  5 mg Oral Q breakfast   And  . metFORMIN  1,000 mg Oral BID WC  . memantine  10 mg Oral QHS  . sodium chloride flush  3 mL  Intravenous Q12H  . vitamin B-12  5,000 mcg Oral Daily   . sodium chloride    . ceFEPime (MAXIPIME) IV Stopped (07/28/17 0123)    Allergies:  Allergies  Allergen Reactions  . Captopril Other (See Comments)    Oral swelling    Social History   Socioeconomic History  . Marital status: Widowed    Spouse name: Not on file  . Number of children: Not on file  . Years of education: Not on file  . Highest education level: Not on file  Occupational History  . Not on file  Social Needs  . Financial resource strain: Not on file  . Food insecurity:    Worry: Not on file    Inability: Not on file  . Transportation needs:    Medical: Not on file    Non-medical: Not on file  Tobacco Use  . Smoking status: Former Smoker    Types: Cigars  . Smokeless tobacco: Never Used  Substance and Sexual Activity  . Alcohol use: No  . Drug use: No  . Sexual activity: Not on file  Lifestyle  . Physical activity:    Days per week: Not on file    Minutes per session: Not on file  . Stress: Not on file  Relationships  . Social connections:    Talks on phone: Not on file    Gets together: Not on file    Attends religious service: Not on file    Active member of club or organization: Not on file    Attends meetings of clubs or organizations: Not on file    Relationship status: Not on file  . Intimate partner violence:    Fear of current or ex partner: Not on file    Emotionally abused: Not on file    Physically abused: Not on file    Forced sexual activity: Not on file  Other Topics Concern  . Not on file  Social History Narrative  . Not on file     History reviewed. No pertinent family history.   Review of Systems Positive for cough congestion Negative for: General:  chills, fever, night sweats or weight changes.  Cardiovascular: PND orthopnea syncope dizziness  Dermatological skin lesions rashes Respiratory: Positive for cough congestion Urologic: Frequent urination urination  at night and hematuria Abdominal: negative for nausea, vomiting, diarrhea, bright red blood per rectum, melena, or hematemesis Neurologic: negative for visual changes, and/or hearing changes  All other systems reviewed and are otherwise negative except as noted above.  Labs: Recent Labs    07/27/17 0621  TROPONINI 0.03*   Lab Results  Component Value Date   WBC 4.0 07/28/2017   HGB 10.1 (L) 07/28/2017   HCT 30.5 (L) 07/28/2017   MCV 86.1 07/28/2017   PLT 96 (L) 07/28/2017    Recent Labs  Lab 07/27/17 0621 07/28/17 0500  NA 142 141  K 3.9 3.5  CL 107 108  CO2 26 28  BUN 21* 20  CREATININE 0.66 0.72  CALCIUM 9.4 8.6*  PROT 7.7  --   BILITOT 0.6  --   ALKPHOS 72  --   ALT 30  --   AST 53*  --   GLUCOSE 106* 110*   No results found for: CHOL, HDL, LDLCALC, TRIG No results found for: DDIMER  Radiology/Studies:  Dg Chest 2 View  Result Date: 07/27/2017 CLINICAL DATA:  Pain following fall.  Cough. EXAM: CHEST - 2 VIEW COMPARISON:  Chest radiograph and chest CT June 07, 2016 FINDINGS: There is mild atelectatic change in the left base with small left pleural effusion. No edema or consolidation. Heart is mildly enlarged with pulmonary vascularity within normal limits. No adenopathy. No pneumothorax. Bones are osteoporotic. IMPRESSION: Small left pleural effusion with mild left base atelectasis. Lungs elsewhere clear. Stable cardiac prominence. Bones diffusely osteoporotic. No pneumothorax. Electronically Signed   By: Lowella Grip III M.D.   On: 07/27/2017 07:01   Ct Head Wo Contrast  Result Date: 07/27/2017 CLINICAL DATA:  Pain following fall.  History of underlying dementia EXAM: CT HEAD WITHOUT CONTRAST TECHNIQUE: Contiguous axial images were obtained from the base of the skull through the vertex without intravenous contrast. COMPARISON:  None. FINDINGS: Brain: There is moderate diffuse atrophy. There is no intracranial mass, hemorrhage, extra-axial fluid collection,  or midline shift. There is fairly mild patchy small vessel disease in the centra semiovale bilaterally. There is no evident acute infarct. Vascular: There is no appreciable hyperdense vessel. There is calcification in each distal vertebral artery and carotid siphon region. Skull: Bony calvarium appears intact. Sinuses/Orbits: There is mucosal thickening in several ethmoid air cells bilaterally. There is mucosal thickening in the right sphenoid sinus. Other visualized paranasal sinuses are clear. 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 Visualized left orbit appears unremarkable. Other: Mastoid air cells are clear. IMPRESSION: Atrophy with mild patchy periventricular small vessel disease. No evident acute infarct. No mass or hemorrhage. Multiple foci of arterial vascular calcification noted. Mucosal thickening noted in several sinus regions. 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 Electronically Signed   By: Lowella Grip III M.D.   On: 07/27/2017 07:03    EKG: Sinus tachycardia with nonspecific ST changes  Weights: Filed Weights   07/27/17 0605 07/27/17 0747  Weight: 135 lb (61.2 kg) 135 lb (61.2 kg)     Physical Exam: Blood pressure (!) 106/58, pulse 74, temperature 98.4 F (36.9 C), temperature source Oral, resp. rate 18, height 5\' 7"  (1.702 m), weight 135 lb (61.2 kg), SpO2 92 %. Body mass index is 21.14 kg/m. General: Well developed, well nourished, in no acute distress. Head eyes ears nose throat: Normocephalic, atraumatic, sclera non-icteric, no xanthomas, nares are without discharge. No apparent thyromegaly and/or mass  Lungs: Normal respiratory effort.  no wheezes, no rales, no rhonchi.  Heart: RRR with normal S1 S2. no murmur gallop, no rub, PMI is normal size and placement, carotid upstroke normal without bruit, jugular venous pressure is normal Abdomen: Soft, non-tender, non-distended with normoactive bowel sounds. No hepatomegaly. No rebound/guarding. No obvious abdominal  masses. Abdominal aorta is normal size without bruit Extremities: No edema. no cyanosis, no clubbing, no ulcers  Peripheral : 2+ bilateral upper extremity pulses, 2+ bilateral femoral pulses, 2+ bilateral dorsal pedal pulse Neuro: Alert and not oriented. No facial asymmetry. No focal deficit. Moves all extremities spontaneously. Musculoskeletal: Normal muscle tone with  kyphosis Psych:  Responds to  questions appropriately with a normal affect.    Assessment: 84 year old male with history of paroxysmal nonvalvular atrial fibrillation essential hypertension currently in normal rhythm with some tachycardia most consistent with reaction to infection with loss of sensorium due to infection and no current evidence of significant congestive heart failure angina or true syncope  Plan: 1.  Continue supportive care of infection bronchitis and change in mental status 2.  Continue medication management for heart rate control of tachycardia likely exacerbated by infection and watch for possible paroxysmal nonvalvular atrial fibrillation 3.  Would not add anticoagulation at this time due to previous use in the recent months with decision making based on fall risk and advanced age as per his primary doctor 4.  No further cardiac diagnostics necessary at this time  Signed, Corey Skains M.D. [facility] 07/28/2017, 9:51 AM

## 2017-07-28 NOTE — Progress Notes (Signed)
PT Cancellation Note  Patient Details Name: John Werner MRN: [id number] DOB: 04-26-1921   Cancelled Treatment:    Reason Eval/Treat Not Completed: Other (comment).  Pt is expecting to go home imminently and declined PT.  Nursing thinks he is moving well so will ck tomorrow if pt is still here.   Ramond Dial 07/28/2017, 1:53 PM   Mee Hives, PT MS Acute Rehab Dept. Number: [place] and [place]

## 2017-08-01 ENCOUNTER — Emergency Department: Payer: Medicare HMO

## 2017-08-01 ENCOUNTER — Encounter: Payer: Self-pay | Admitting: Emergency Medicine

## 2017-08-01 ENCOUNTER — Emergency Department
Admission: EM | Admit: 2017-08-01 | Discharge: 2017-08-01 | Disposition: A | Discharge status: Home / Self Care | Payer: Medicare HMO | Attending: Emergency Medicine | Admitting: Emergency Medicine

## 2017-08-01 ENCOUNTER — Other Ambulatory Visit: Payer: Self-pay

## 2017-08-01 DIAGNOSIS — R079 Chest pain, unspecified: Secondary | ICD-10-CM | POA: Diagnosis not present

## 2017-08-01 DIAGNOSIS — Z5321 Procedure and treatment not carried out due to patient leaving prior to being seen by health care provider: Secondary | ICD-10-CM | POA: Insufficient documentation

## 2017-08-01 LAB — CBC
HEMATOCRIT: 34.5 % — AB (ref 40.0–52.0)
HEMOGLOBIN: 11.4 g/dL — AB (ref 13.0–18.0)
MCH: 28.4 pg (ref 26.0–34.0)
MCHC: 33.1 g/dL (ref 32.0–36.0)
MCV: 85.9 fL (ref 80.0–100.0)
PLATELETS: 142 10*3/uL — AB (ref 150–440)
RBC: 4.02 MIL/uL — AB (ref 4.40–5.90)
RDW: 16.7 % — ABNORMAL HIGH (ref 11.5–14.5)
WBC: 5.5 10*3/uL (ref 3.8–10.6)

## 2017-08-01 LAB — BASIC METABOLIC PANEL
Anion gap: 8 (ref 5–15)
BUN: 21 mg/dL — ABNORMAL HIGH (ref 6–20)
CHLORIDE: 107 mmol/L (ref 101–111)
CO2: 26 mmol/L (ref 22–32)
Calcium: 8.8 mg/dL — ABNORMAL LOW (ref 8.9–10.3)
Creatinine, Ser: 0.86 mg/dL (ref 0.61–1.24)
GFR calc non Af Amer: >60 mL/min (ref >60)
Glucose, Bld: 178 mg/dL — ABNORMAL HIGH (ref 65–99)
POTASSIUM: 4.1 mmol/L (ref 3.5–5.1)
SODIUM: 141 mmol/L (ref 135–145)

## 2017-08-01 LAB — CULTURE, BLOOD (ROUTINE X 2)
CULTURE: NO GROWTH
Culture: NO GROWTH
SPECIAL REQUESTS: ADEQUATE
Special Requests: ADEQUATE

## 2017-08-01 LAB — TROPONIN I: Troponin I: <0.03 ng/mL (ref <0.03)

## 2017-08-01 NOTE — ED Notes (Signed)
Pt daughter to STAT desk. States her dad is agitated and wanting to go home. Pt denies chest pain and states he is not sure why his daughter made him come. No distress noted.

## 2017-08-01 NOTE — ED Triage Notes (Signed)
Pt to triage via w/c with no distress noted; pt denies any c/o; pt accomp by daughter who st pt was "clutching his chest" tonight

## 2017-08-01 NOTE — ED Notes (Signed)
Pt daughter to STAT desk and states she would like to take pt home and to his pcp in the morning.

## 2017-09-18 ENCOUNTER — Other Ambulatory Visit: Payer: Self-pay | Admitting: Internal Medicine

## 2017-09-18 ENCOUNTER — Ambulatory Visit
Admission: RE | Admit: 2017-09-18 | Discharge: 2017-09-18 | Disposition: A | Discharge status: Home / Self Care | Payer: Medicare HMO | Source: Ambulatory Visit | Attending: Internal Medicine | Admitting: Internal Medicine

## 2017-09-18 DIAGNOSIS — S2242XA Multiple fractures of ribs, left side, initial encounter for closed fracture: Secondary | ICD-10-CM | POA: Insufficient documentation

## 2017-09-18 DIAGNOSIS — E11649 Type 2 diabetes mellitus with hypoglycemia without coma: Secondary | ICD-10-CM | POA: Insufficient documentation

## 2017-09-18 DIAGNOSIS — K5730 Diverticulosis of large intestine without perforation or abscess without bleeding: Secondary | ICD-10-CM | POA: Diagnosis not present

## 2017-09-18 DIAGNOSIS — R188 Other ascites: Secondary | ICD-10-CM

## 2017-09-18 DIAGNOSIS — J90 Pleural effusion, not elsewhere classified: Secondary | ICD-10-CM | POA: Insufficient documentation

## 2017-09-18 DIAGNOSIS — Z794 Long term (current) use of insulin: Secondary | ICD-10-CM | POA: Diagnosis not present

## 2017-09-18 DIAGNOSIS — K7460 Unspecified cirrhosis of liver: Secondary | ICD-10-CM | POA: Insufficient documentation

## 2017-09-18 DIAGNOSIS — I700 Atherosclerosis of aorta: Secondary | ICD-10-CM | POA: Insufficient documentation

## 2017-09-18 DIAGNOSIS — R609 Edema, unspecified: Secondary | ICD-10-CM

## 2017-09-18 DIAGNOSIS — I517 Cardiomegaly: Secondary | ICD-10-CM | POA: Insufficient documentation

## 2017-09-18 DIAGNOSIS — X58XXXA Exposure to other specified factors, initial encounter: Secondary | ICD-10-CM | POA: Insufficient documentation

## 2017-09-18 DIAGNOSIS — K229 Disease of esophagus, unspecified: Secondary | ICD-10-CM | POA: Insufficient documentation

## 2017-09-18 HISTORY — DX: Malignant (primary) neoplasm, unspecified: C80.1

## 2017-09-18 MED ORDER — IOPAMIDOL (ISOVUE-300) INJECTION 61%
85.0000 mL | Freq: Once | INTRAVENOUS | Status: AC | PRN
  Administered 2017-09-18: 85 mL via INTRAVENOUS

## 2017-09-28 ENCOUNTER — Ambulatory Visit: Payer: Medicare HMO | Admitting: Gastroenterology

## 2017-09-28 ENCOUNTER — Other Ambulatory Visit: Payer: Self-pay

## 2017-09-28 ENCOUNTER — Other Ambulatory Visit
Admission: RE | Admit: 2017-09-28 | Discharge: 2017-09-28 | Disposition: A | Discharge status: Home / Self Care | Payer: Medicare HMO | Source: Ambulatory Visit | Attending: Gastroenterology | Admitting: Gastroenterology

## 2017-09-28 ENCOUNTER — Encounter: Payer: Self-pay | Admitting: Gastroenterology

## 2017-09-28 VITALS — BP 145/79 | HR 93 | Ht 65.0 in | Wt 146.0 lb

## 2017-09-28 DIAGNOSIS — Z794 Long term (current) use of insulin: Secondary | ICD-10-CM | POA: Diagnosis not present

## 2017-09-28 DIAGNOSIS — K7290 Hepatic failure, unspecified without coma: Secondary | ICD-10-CM | POA: Diagnosis not present

## 2017-09-28 DIAGNOSIS — E1169 Type 2 diabetes mellitus with other specified complication: Secondary | ICD-10-CM | POA: Diagnosis not present

## 2017-09-28 LAB — BASIC METABOLIC PANEL
Anion gap: 11 (ref 5–15)
BUN: 17 mg/dL (ref 6–20)
CHLORIDE: 101 mmol/L (ref 101–111)
CO2: 29 mmol/L (ref 22–32)
CREATININE: 0.8 mg/dL (ref 0.61–1.24)
Calcium: 9.1 mg/dL (ref 8.9–10.3)
GFR calc Af Amer: >60 mL/min (ref >60)
GFR calc non Af Amer: >60 mL/min (ref >60)
GLUCOSE: 196 mg/dL — AB (ref 65–99)
Potassium: 3.5 mmol/L (ref 3.5–5.1)
SODIUM: 141 mmol/L (ref 135–145)

## 2017-09-28 LAB — HEMOGLOBIN A1C
Hgb A1c MFr Bld: 6.5 % — ABNORMAL HIGH (ref 4.8–5.6)
Mean Plasma Glucose: 139.85 mg/dL

## 2017-09-28 LAB — HEPATIC FUNCTION PANEL
ALT: 17 U/L (ref 17–63)
AST: 36 U/L (ref 15–41)
Albumin: 3.4 g/dL — ABNORMAL LOW (ref 3.5–5.0)
Alkaline Phosphatase: 65 U/L (ref 38–126)
BILIRUBIN DIRECT: 0.2 mg/dL (ref 0.1–0.5)
BILIRUBIN INDIRECT: 0.6 mg/dL (ref 0.3–0.9)
Total Bilirubin: 0.8 mg/dL (ref 0.3–1.2)
Total Protein: 7.1 g/dL (ref 6.5–8.1)

## 2017-09-28 LAB — PROTIME-INR
INR: 1.06
Prothrombin Time: 13.7 seconds (ref 11.4–15.2)

## 2017-09-28 LAB — MAGNESIUM: MAGNESIUM: 1.7 mg/dL (ref 1.7–2.4)

## 2017-09-28 LAB — FERRITIN: Ferritin: 48 ng/mL (ref 24–336)

## 2017-09-28 MED ORDER — FUROSEMIDE 40 MG PO TABS: 40.0000 mg | ORAL_TABLET | Freq: Every day | ORAL | 0 refills | Status: DC

## 2017-09-28 MED ORDER — ALBUMIN HUMAN 25 % IV SOLN: 25.0000 g | Freq: Once | INTRAVENOUS | Status: AC

## 2017-09-28 MED ORDER — LACTULOSE 10 GM/15ML PO SOLN: 20.0000 g | Freq: Two times a day (BID) | ORAL | 0 refills | Status: DC

## 2017-09-28 MED ORDER — SPIRONOLACTONE 100 MG PO TABS: 100.0000 mg | ORAL_TABLET | Freq: Every day | ORAL | 0 refills | Status: DC

## 2017-09-28 NOTE — Discharge Instructions (Signed)
Paracentesis, Care After °Refer to this sheet in the next few weeks. These instructions provide you with information about caring for yourself after your procedure. Your health care provider may also give you more specific instructions. Your treatment has been planned according to current medical practices, but problems sometimes occur. Call your health care provider if you have any problems or questions after your procedure. °What can I expect after the procedure? °After your procedure, it is common to have a small amount of clear fluid coming from the puncture site. °Follow these instructions at home: °· Return to your normal activities as told by your health care provider. Ask your health care provider what activities are safe for you. °· Take over-the-counter and prescription medicines only as told by your health care provider. °· Do not take baths, swim, or use a hot tub until your health care provider approves. °· Follow instructions from your health care provider about: °? How to take care of your puncture site. °? When and how you should change your bandage (dressing). °? When you should remove your dressing. °· Check your puncture area every day signs of infection. Watch for: °? Redness, swelling, or pain. °? Fluid, blood, or pus. °· Keep all follow-up visits as told by your health care provider. This is important. °Contact a health care provider if: °· You have redness, swelling, or pain at your puncture site. °· You start to have more clear fluid coming from your puncture site. °· You have blood or pus coming from your puncture site. °· You have chills. °· You have a fever. °Get help right away if: °· You develop chest pain or shortness of breath. °· You develop increasing pain, discomfort, or swelling in your abdomen. °· You feel dizzy or light-headed or you pass out. °This information is not intended to replace advice given to you by your health care provider. Make sure you discuss any questions you  have with your health care provider. °Document Released: 08/11/2014 Document Revised: 09/02/2015 Document Reviewed: 06/09/2014 °Elsevier Interactive Patient Education © 2018 Elsevier Inc. ° °

## 2017-09-28 NOTE — Patient Instructions (Addendum)
1. Start lasix 40mg  daily with breakfast 2. Start spironolactone 100mg  daily with breakfast 3. Start lactulose syrup twice daily, decrease to once a day if having more than 3BMs daily 4. F/u in 2weeks  Please call our office to speak with my nurse Barnie Alderman [phone] during business hours from 8am to 4pm if you have any questions/concerns. During after hours, you will be redirected to on call GI physician. For any emergency please call 911 or go the nearest emergency room.    John Darby, MD [address]  [place]  [place], [place] [address]  Main: [phone]  Fax: [phone]     Low-Sodium Eating Plan Sodium, which is an element that makes up salt, helps you maintain a healthy balance of fluids in your body. Too much sodium can increase your blood pressure and cause fluid and waste to be held in your body. Your health care provider or dietitian may recommend following this plan if you have high blood pressure (hypertension), kidney disease, liver disease, or heart failure. Eating less sodium can help lower your blood pressure, reduce swelling, and protect your heart, liver, and kidneys. What are tips for following this plan? General guidelines  Most people on this plan should limit their sodium intake to 1,500-2,000 mg (milligrams) of sodium each day. Reading food labels  The Nutrition Facts label lists the amount of sodium in one serving of the food. If you eat more than one serving, you must multiply the listed amount of sodium by the number of servings.  Choose foods with less than 140 mg of sodium per serving.  Avoid foods with 300 mg of sodium or more per serving. Shopping  Look for lower-sodium products, often labeled as "low-sodium" or "no salt added."  Always check the sodium content even if foods are labeled as "unsalted" or "no salt added".  Buy fresh foods. ? Avoid canned foods and premade or frozen meals. ? Avoid canned, cured, or processed  meats  Buy breads that have less than 80 mg of sodium per slice. Cooking  Eat more home-cooked food and less restaurant, buffet, and fast food.  Avoid adding salt when cooking. Use salt-free seasonings or herbs instead of table salt or sea salt. Check with your health care provider or pharmacist before using salt substitutes.  Cook with plant-based oils, such as canola, sunflower, or olive oil. Meal planning  When eating at a restaurant, ask that your food be prepared with less salt or no salt, if possible.  Avoid foods that contain MSG (monosodium glutamate). MSG is sometimes added to [place] food, bouillon, and some canned foods. What foods are recommended? The items listed may not be a complete list. Talk with your dietitian about what dietary choices are best for you. Grains Low-sodium cereals, including oats, puffed wheat and rice, and shredded wheat. Low-sodium crackers. Unsalted rice. Unsalted pasta. Low-sodium bread. Whole-grain breads and whole-grain pasta. Vegetables Fresh or frozen vegetables. "No salt added" canned vegetables. "No salt added" tomato sauce and paste. Low-sodium or reduced-sodium tomato and vegetable juice. Fruits Fresh, frozen, or canned fruit. Fruit juice. Meats and other protein foods Fresh or frozen (no salt added) meat, poultry, seafood, and fish. Low-sodium canned tuna and salmon. Unsalted nuts. Dried peas, beans, and lentils without added salt. Unsalted canned beans. Eggs. Unsalted nut butters. Dairy Milk. Soy milk. Cheese that is naturally low in sodium, such as ricotta cheese, fresh mozzarella, or Swiss cheese Low-sodium or reduced-sodium cheese. Cream cheese. Yogurt. Fats and oils  Unsalted butter. Unsalted margarine with no trans fat. Vegetable oils such as canola or olive oils. Seasonings and other foods Fresh and dried herbs and spices. Salt-free seasonings. Low-sodium mustard and ketchup. Sodium-free salad dressing. Sodium-free light mayonnaise.  Fresh or refrigerated horseradish. Lemon juice. Vinegar. Homemade, reduced-sodium, or low-sodium soups. Unsalted popcorn and pretzels. Low-salt or salt-free chips. What foods are not recommended? The items listed may not be a complete list. Talk with your dietitian about what dietary choices are best for you. Grains Instant hot cereals. Bread stuffing, pancake, and biscuit mixes. Croutons. Seasoned rice or pasta mixes. Noodle soup cups. Boxed or frozen macaroni and cheese. Regular salted crackers. Self-rising flour. Vegetables Sauerkraut, pickled vegetables, and relishes. Olives. [place] fries. Onion rings. Regular canned vegetables (not low-sodium or reduced-sodium). Regular canned tomato sauce and paste (not low-sodium or reduced-sodium). Regular tomato and vegetable juice (not low-sodium or reduced-sodium). Frozen vegetables in sauces. Meats and other protein foods Meat or fish that is salted, canned, smoked, spiced, or pickled. Bacon, ham, sausage, hotdogs, corned beef, chipped beef, packaged lunch meats, salt pork, jerky, pickled herring, anchovies, regular canned tuna, sardines, salted nuts. Dairy Processed cheese and cheese spreads. Cheese curds. Blue cheese. Feta cheese. String cheese. Regular cottage cheese. Buttermilk. Canned milk. Fats and oils Salted butter. Regular margarine. Ghee. Bacon fat. Seasonings and other foods Onion salt, garlic salt, seasoned salt, table salt, and sea salt. Canned and packaged gravies. Worcestershire sauce. Tartar sauce. Barbecue sauce. Teriyaki sauce. Soy sauce, including reduced-sodium. Steak sauce. Fish sauce. Oyster sauce. Cocktail sauce. Horseradish that you find on the shelf. Regular ketchup and mustard. Meat flavorings and tenderizers. Bouillon cubes. Hot sauce and Tabasco sauce. Premade or packaged marinades. Premade or packaged taco seasonings. Relishes. Regular salad dressings. Salsa. Potato and tortilla chips. Corn chips and puffs. Salted popcorn and  pretzels. Canned or dried soups. Pizza. Frozen entrees and pot pies. Summary  Eating less sodium can help lower your blood pressure, reduce swelling, and protect your heart, liver, and kidneys.  Most people on this plan should limit their sodium intake to 1,500-2,000 mg (milligrams) of sodium each day.  Canned, boxed, and frozen foods are high in sodium. Restaurant foods, fast foods, and pizza are also very high in sodium. You also get sodium by adding salt to food.  Try to cook at home, eat more fresh fruits and vegetables, and eat less fast food, canned, processed, or prepared foods. This information is not intended to replace advice given to you by your health care provider. Make sure you discuss any questions you have with your health care provider. Document Released: 09/16/2001 Document Revised: 03/20/2016 Document Reviewed: 03/20/2016 Elsevier Interactive Patient Education  [employer].

## 2017-09-28 NOTE — Progress Notes (Signed)
John Darby, MD [address]  [place]  [place], [place] [address]  Main: [phone]  Fax: [phone]    Gastroenterology Consultation  Referring Provider:     Judi Cong, MD Primary Care Physician:  Idelle Crouch, MD Primary Gastroenterologist:  Dr. Cephas Werner Reason for Consultation: Decompensated cirrhosis with ascites        HPI:   John Werner is a 84 y.o. Caucasian male referred by Dr. Doy Hutching, Leonie Douglas, MD  for consultation & management of decompensated cirrhosis with ascites. Patient has history of metabolic syndrome, severe vasculopathy, history of A. Fib, dementia who lives in a group home, Sr[facility] Living facility is accompanied by his daughter today who is the health care power of attorney. Most of the history is obtained from his daughter. Patient is noticed to have progressively worsening abdominal distention as well as bilateral swelling of legs for the last 2 months associated with decreased appetite, increasing lethargy. Underwent imaging studies by his PCP and was found to have cirrhosis with moderate volume ascites. Patient was started on furosemide 20 mg daily which he finished the course and according to his daughter, swelling did not improve. His daughter reports that this is a new diagnosis for him. She is questioning me about previous CT that was performed in 04/2017 and why the diagnosis of cirrhosis was not given at that time. The liver texture was described as heterogeneous but definite nodularity was not described back in January. Reviewing his records revealed that he has chronic thrombocytopenia dating back to 2010. He was previously seen at [facility]. His most recent labs from last month revealed normocytic anemia, normal LFTs, and normal platelets Patient was never a heavy drinker in his life Patient had a 2-D echo this week which revealed EF of 55%, no evidence of significant valvular abnormalities  NSAIDs:  none  Antiplts/Anticoagulants/Anti thrombotics: None  GI Procedures:colonoscopy several years ago  Past Medical History:  Diagnosis Date  . Atrial fibrillation ([place])   . AVD (aortic valve disease)   . Cancer ([place])    skin  . Carotid stenosis   . Dementia   . Diabetes mellitus without complication ([place])   . Glass eye   . Heart disease   . History of colonic polyps   . Hyperlipidemia   . Hypertension   . Memory loss   . TIA (transient ischemic attack)     Past Surgical History:  Procedure Laterality Date  . CATARACT EXTRACTION Left     Current Outpatient Medications:  .  aspirin EC 81 MG tablet, Take 81 mg by mouth daily. , Disp: , Rfl:  .  BD INSULIN SYRINGE U/F 30G X 1/2" 0.5 ML MISC, daily., Disp: , Rfl: 3 .  Cyanocobalamin 5000 MCG SUBL, Place 5,000 mcg under the tongue daily., Disp: , Rfl:  .  diltiazem (CARDIZEM CD) 180 MG 24 hr capsule, Take 180 mg by mouth daily. , Disp: , Rfl:  .  fluorouracil (EFUDEX) 5 % cream, , Disp: , Rfl:  .  glucose blood (ONE TOUCH ULTRA TEST) test strip, USE 2 (TWO) TIMES DAILY. DIAGNOSIS: E11.65, Disp: , Rfl:  .  insulin aspart protamine - aspart (NOVOLOG 70/30 MIX) (70-30) 100 UNIT/ML FlexPen, Inject under skin 36 units in the AM and 16 units in the PM, Disp: , Rfl:  .  insulin detemir (LEVEMIR) 100 UNIT/ML injection, Inject 20 Units into the skin daily., Disp: , Rfl:  .  Insulin  Syringe-Needle U-100 (BD INSULIN SYRINGE U/F) 30G X 1/2" 0.5 ML MISC, Use once daily., Disp: , Rfl:  .  memantine (NAMENDA) 10 MG tablet, Take 10 mg by mouth at bedtime., Disp: , Rfl:  .  Multiple Vitamin (MULTI-VITAMINS) TABS, Take 1 tablet by mouth daily. , Disp: , Rfl:  .  nitrofurantoin (MACRODANTIN) 50 MG capsule, TAKE 1 CAPSULE BY MOUTH EVERY DAY IN THE EVENING, Disp: , Rfl: 3 .  pioglitazone (ACTOS) 30 MG tablet, Take 30 mg by mouth daily. , Disp: , Rfl:  .  PSYLLIUM PO, Take by mouth., Disp: , Rfl:  .  sitaGLIPtin-metformin (JANUMET) 50-1000 MG tablet,  Take 1 tablet by mouth 2 (two) times daily., Disp: , Rfl:  .  sodium chloride (OCEAN) 0.65 % nasal spray, Place into the nose., Disp: , Rfl:  .  acetaminophen (TYLENOL) 325 MG tablet, Take 325 mg by mouth every 6 (six) hours as needed for mild pain or moderate pain., Disp: , Rfl:  .  acetaminophen (TYLENOL) 500 MG tablet, Take by mouth., Disp: , Rfl:  .  furosemide (LASIX) 40 MG tablet, Take 1 tablet (40 mg total) by mouth daily with breakfast., Disp: 90 tablet, Rfl: 0 .  lactulose (CHRONULAC) 10 GM/15ML solution, Take 30 mLs (20 g total) by mouth 2 (two) times daily., Disp: 240 mL, Rfl: 0 .  spironolactone (ALDACTONE) 100 MG tablet, Take 1 tablet (100 mg total) by mouth daily with breakfast., Disp: 90 tablet, Rfl: 0  History reviewed. No pertinent family history.   Social History   Tobacco Use  . Smoking status: Former Smoker    Types: Cigars  . Smokeless tobacco: Never Used  Substance Use Topics  . Alcohol use: No  . Drug use: No    Allergies as of 09/28/2017 - Review Complete 09/28/2017  Allergen Reaction Noted  . Captopril Other (See Comments) 05/05/2017  . Metformin Other (See Comments) 01/31/2011  . Other Other (See Comments) 09/28/2017    Review of Systems:    All systems reviewed and negative except where noted in HPI.   Physical Exam:  BP (!) 145/79   Pulse 93   Ht 5\' 5"  (1.651 m)   Wt 146 lb (66.2 kg)   BMI 24.30 kg/m  No LMP for male patient.  General:   Alert, thin built and cooperative in NAD Head:  Normocephalic and atraumatic, bitemporal wasting. Eyes:  Sclera clear, no icterus.   Conjunctiva pink. Ears:  Normal auditory acuity. Nose:  No deformity, discharge, or lesions. Mouth:  No deformity or lesions,oropharynx pink & dry mucous membranes. Neck:  Supple; no masses or thyromegaly. Lungs:  Respirations even and unlabored.  Clear throughout to auscultation.   No wheezes, crackles, or rhonchi. No acute distress. Heart:  Regular rate and rhythm; no  murmurs, clicks, rubs, or gallops. Abdomen:  Normal bowel sounds. Soft, non-tender and diffusely distended, moderate, tympanic to percussion in the central abdomen, dull in bilateral flanks, without masses, hepatosplenomegaly or hernias noted.  No guarding or rebound tenderness.   Rectal: Not performed Msk:  Symmetrical without gross deformities. Good, equal movement & strength bilaterally. Pulses:  Normal pulses noted. Extremities:  No clubbing, 3+edema.  No cyanosis. Neurologic:  Alert and oriented x1;  grossly normal neurologically. Skin:  Intact without significant lesions or rashes. No jaundice. Lymph Nodes:  No significant cervical adenopathy. Psych:  Alert and cooperative. Normal mood and affect.  Imaging Studies: reviewed  Assessment and Plan:   John Werner is a 84 y.o.  Caucasian male with severe vasculopathy, diabetes, on insulin, history of hypertension, hyperlipidemia, carotid artery stenosis, chronic thrombocytopenia seen in consultation for 2 months history of progressively worsening abdominal distention and swelling of legs. Cross-sectional imaging revealed cirrhosis of liver with moderate volume ascites. He has biochemical, imaging, clinical evidence of decompensated cirrhosis. Probably [place] etiology  Decompensated cirrhosis: Recommend completion of secondary liver disease workup Paracentesis with fluid analysis to confirm for the evidence portal hypertension and rule out malignancy Start furosemide 40 mg daily and spironolactone 100 mg daily Recommend 2 g sodium diet, material provided Check BMP and magnesium today Varices: unknown [place] screening: CT A/P in 09/2017 did not reveal lesions. Check AFP Chronic constipation and probable hepatic encephalopathy: Start lactulose twice daily  Follow up in 2 weeks   John Darby, MD

## 2017-09-29 LAB — HEPATITIS PANEL, ACUTE
HEP B S AG: NEGATIVE
Hep A IgM: NEGATIVE
Hep B C IgM: NEGATIVE

## 2017-09-29 LAB — ANTI-DNA ANTIBODY, DOUBLE-STRANDED: ds DNA Ab: 17 IU/mL — ABNORMAL HIGH (ref 0–9)

## 2017-09-29 LAB — AFP TUMOR MARKER: AFP, Serum, Tumor Marker: 9.2 ng/mL — ABNORMAL HIGH (ref 0.0–8.3)

## 2017-09-29 LAB — HIV ANTIBODY (ROUTINE TESTING W REFLEX): HIV Screen 4th Generation wRfx: NONREACTIVE

## 2017-09-29 LAB — CERULOPLASMIN: CERULOPLASMIN: 42.5 mg/dL — AB (ref 16.0–31.0)

## 2017-09-30 LAB — ANTI-SMOOTH MUSCLE ANTIBODY, IGG: F-Actin IgG: 9 Units (ref 0–19)

## 2017-09-30 LAB — ANTI-MICROSOMAL ANTIBODY LIVER / KIDNEY: LKM1 AB: 0.9 U (ref 0.0–20.0)

## 2017-09-30 LAB — MITOCHONDRIAL ANTIBODIES: Mitochondrial M2 Ab, IgG: <20 Units (ref 0.0–20.0)

## 2017-10-01 ENCOUNTER — Ambulatory Visit
Admission: RE | Admit: 2017-10-01 | Discharge: 2017-10-01 | Disposition: A | Discharge status: Home / Self Care | Payer: Medicare HMO | Source: Ambulatory Visit | Attending: Gastroenterology | Admitting: Gastroenterology

## 2017-10-01 DIAGNOSIS — K7290 Hepatic failure, unspecified without coma: Secondary | ICD-10-CM | POA: Diagnosis present

## 2017-10-01 DIAGNOSIS — R188 Other ascites: Secondary | ICD-10-CM | POA: Diagnosis not present

## 2017-10-01 LAB — LACTATE DEHYDROGENASE, PLEURAL OR PERITONEAL FLUID: LD, Fluid: 59 U/L — ABNORMAL HIGH (ref 3–23)

## 2017-10-01 LAB — PROTEIN, PLEURAL OR PERITONEAL FLUID

## 2017-10-01 LAB — BODY FLUID CELL COUNT WITH DIFFERENTIAL
EOS FL: 0 %
Lymphs, Fluid: 67 %
Monocyte-Macrophage-Serous Fluid: 28 %
NEUTROPHIL FLUID: 5 %
OTHER CELLS FL: 0 %
Total Nucleated Cell Count, Fluid: 412 cu mm

## 2017-10-01 LAB — ALPHA-1 ANTITRYPSIN PHENOTYPE: A-1 Antitrypsin, Ser: 178 mg/dL (ref 90–200)

## 2017-10-01 LAB — ALBUMIN, PLEURAL OR PERITONEAL FLUID: Albumin, Fluid: 1.3 g/dL

## 2017-10-01 MED ORDER — ALBUMIN HUMAN 25 % IV SOLN
INTRAVENOUS | Status: AC
  Administered 2017-10-01: 25 g
  Filled 2017-10-01: qty 100

## 2017-10-01 NOTE — Procedures (Signed)
Ascites, abd distention  S/p lg vol paracentesis  No comp Stable Labs sent Full report in pacs

## 2017-10-02 LAB — PROTEIN, BODY FLUID (OTHER): Total Protein, Body Fluid Other: 2.4 g/dL

## 2017-10-02 LAB — TRIGLYCERIDES, BODY FLUIDS: Triglycerides, Fluid: 31 mg/dL

## 2017-10-03 ENCOUNTER — Ambulatory Visit: Payer: Medicare HMO | Admitting: Gastroenterology

## 2017-10-03 LAB — CYTOLOGY - NON PAP

## 2017-10-03 LAB — PH, BODY FLUID: pH, Body Fluid: 7.5

## 2017-10-05 LAB — BODY FLUID CULTURE: Culture: NO GROWTH

## 2017-10-05 LAB — ACID FAST SMEAR (AFB, MYCOBACTERIA): Acid Fast Smear: NEGATIVE

## 2017-10-19 ENCOUNTER — Ambulatory Visit: Payer: Medicare HMO | Admitting: Gastroenterology

## 2017-10-19 ENCOUNTER — Encounter: Payer: Self-pay | Admitting: Gastroenterology

## 2017-10-19 VITALS — BP 137/85 | HR 87 | Resp 17 | Ht 65.0 in | Wt 129.8 lb

## 2017-10-19 DIAGNOSIS — K7460 Unspecified cirrhosis of liver: Secondary | ICD-10-CM

## 2017-10-19 DIAGNOSIS — R188 Other ascites: Secondary | ICD-10-CM

## 2017-10-19 DIAGNOSIS — K7290 Hepatic failure, unspecified without coma: Secondary | ICD-10-CM | POA: Diagnosis not present

## 2017-10-19 NOTE — Progress Notes (Signed)
Cephas Darby, MD [address]  [place]  [place], [place] [address]  Main: [phone]  Fax: [phone]    Gastroenterology Consultation  Referring Provider:     Idelle Crouch, MD Primary Care Physician:  Idelle Crouch, MD Primary Gastroenterologist:  Dr. Cephas Darby Reason for Consultation: Decompensated cirrhosis with ascites        HPI:   John Werner is a 84 y.o. Caucasian male referred by Dr. Doy Hutching, Leonie Douglas, MD  for consultation & management of decompensated cirrhosis with ascites. Patient has history of metabolic syndrome, severe vasculopathy, history of A. Fib, dementia who lives in a group home, Sr[facility] Living facility is accompanied by his daughter today who is the health care power of attorney. Most of the history is obtained from his daughter. Patient is noticed to have progressively worsening abdominal distention as well as bilateral swelling of legs for the last 2 months associated with decreased appetite, increasing lethargy. Underwent imaging studies by his PCP and was found to have cirrhosis with moderate volume ascites. Patient was started on furosemide 20 mg daily which he finished the course and according to his daughter, swelling did not improve. His daughter reports that this is a new diagnosis for him. She is questioning me about previous CT that was performed in 04/2017 and why the diagnosis of cirrhosis was not given at that time. The liver texture was described as heterogeneous but definite nodularity was not described back in January. Reviewing his records revealed that he has chronic thrombocytopenia dating back to 2010. He was previously seen at [facility]. His most recent labs from last month revealed normocytic anemia, normal LFTs, and normal platelets Patient was never a heavy drinker in his life Patient had a 2-D echo this week which revealed EF of 55%, no evidence of significant valvular abnormalities  Follow-up  visit 10/19/2017 Since last visit, I performed secondary liver disease workup which was unremarkable. He had therapeutic and diagnostic paracentesis performed, 4.5 L of clear yellow fluid was removed. Fluid analysis did not reveal evidence of SBP, SAAG >1.1 consistent with portal hypertension. Cytology was negative for malignancy. I started him on Lasix 40 mg and spironolactone 100 mg daily. He is accompanied by his daughter today who reports that patient continues to lose weight. His swelling of legs has improved but has not completely resolved. He is on lactulose twice daily and his abdomen is distended. His appetite fluctuates. His daughter reports that he is consuming much soft, eats more protein. She is unsure whether patient is constipated or not as he lives in a senior citizen assisted living facility.  NSAIDs: none  Antiplts/Anticoagulants/Anti thrombotics: None  GI Procedures:colonoscopy several years ago  Past Medical History:  Diagnosis Date  . Atrial fibrillation ([place])   . AVD (aortic valve disease)   . Cancer ([place])    skin  . Carotid stenosis   . Dementia   . Diabetes mellitus without complication ([place])   . Glass eye   . Heart disease   . History of colonic polyps   . Hyperlipidemia   . Hypertension   . Memory loss   . TIA (transient ischemic attack)     Past Surgical History:  Procedure Laterality Date  . CATARACT EXTRACTION Left     Current Outpatient Medications:  .  aspirin EC 81 MG tablet, Take 81 mg by mouth daily. , Disp: , Rfl:  .  BD INSULIN SYRINGE U/F 30G X 1/2"  0.5 ML MISC, daily., Disp: , Rfl: 3 .  Cyanocobalamin 5000 MCG SUBL, Place 5,000 mcg under the tongue daily., Disp: , Rfl:  .  diltiazem (CARDIZEM CD) 180 MG 24 hr capsule, Take 180 mg by mouth daily. , Disp: , Rfl:  .  fluorouracil (EFUDEX) 5 % cream, , Disp: , Rfl:  .  furosemide (LASIX) 40 MG tablet, Take 1 tablet (40 mg total) by mouth daily with breakfast., Disp: 90 tablet, Rfl: 0 .  glucose  blood (ONE TOUCH ULTRA TEST) test strip, USE 2 (TWO) TIMES DAILY. DIAGNOSIS: E11.65, Disp: , Rfl:  .  insulin detemir (LEVEMIR) 100 UNIT/ML injection, Inject 20 Units into the skin daily., Disp: , Rfl:  .  Insulin Syringe-Needle U-100 (BD INSULIN SYRINGE U/F) 30G X 1/2" 0.5 ML MISC, Use once daily., Disp: , Rfl:  .  lactulose (CHRONULAC) 10 GM/15ML solution, Take 30 mLs (20 g total) by mouth 2 (two) times daily., Disp: 240 mL, Rfl: 0 .  memantine (NAMENDA) 10 MG tablet, Take 10 mg by mouth at bedtime., Disp: , Rfl:  .  Multiple Vitamin (MULTI-VITAMINS) TABS, Take 1 tablet by mouth daily. , Disp: , Rfl:  .  nitrofurantoin (MACRODANTIN) 50 MG capsule, TAKE 1 CAPSULE BY MOUTH EVERY DAY IN THE EVENING, Disp: , Rfl: 3 .  pioglitazone (ACTOS) 30 MG tablet, Take 30 mg by mouth daily. , Disp: , Rfl:  .  sitaGLIPtin-metformin (JANUMET) 50-1000 MG tablet, Take 1 tablet by mouth 2 (two) times daily., Disp: , Rfl:  .  sodium chloride (OCEAN) 0.65 % nasal spray, Place into the nose., Disp: , Rfl:  .  spironolactone (ALDACTONE) 100 MG tablet, Take 1 tablet (100 mg total) by mouth daily with breakfast., Disp: 90 tablet, Rfl: 0 .  acetaminophen (TYLENOL) 325 MG tablet, Take 325 mg by mouth every 6 (six) hours as needed for mild pain or moderate pain., Disp: , Rfl:  .  acetaminophen (TYLENOL) 500 MG tablet, Take by mouth., Disp: , Rfl:  .  insulin aspart protamine - aspart (NOVOLOG 70/30 MIX) (70-30) 100 UNIT/ML FlexPen, Inject under skin 36 units in the AM and 16 units in the PM, Disp: , Rfl:  .  PSYLLIUM PO, Take by mouth., Disp: , Rfl:   Current Facility-Administered Medications:  .  albumin human 25 % solution 25 g, 25 g, Intravenous, Once, Kailia Starry, Tally Due, MD  No family history on file.   Social History   Tobacco Use  . Smoking status: Former Smoker    Types: Cigars  . Smokeless tobacco: Never Used  Substance Use Topics  . Alcohol use: No  . Drug use: No    Allergies as of 10/19/2017 - Review  Complete 10/19/2017  Allergen Reaction Noted  . Zanaflex [tizanidine hcl] Anaphylaxis 10/19/2017  . Captopril Other (See Comments) 05/05/2017  . Other Other (See Comments) 09/28/2017    Review of Systems:    All systems reviewed and negative except where noted in HPI.   Physical Exam:  BP 137/85 (BP Location: Left Arm, Patient Position: Sitting, Cuff Size: Normal)   Pulse 87   Resp 17   Ht 5\' 5"  (1.651 m)   Wt 129 lb 12.8 oz (58.9 kg)   BMI 21.60 kg/m  No LMP for male patient.  General:   Alert, thin built and cooperative in NAD, cachectic Head:  Normocephalic and atraumatic, bitemporal wasting. Eyes:  Sclera clear, no icterus.   Conjunctiva pink. Ears:  Normal auditory acuity. Nose:  No deformity,  discharge, or lesions. Mouth:  No deformity or lesions,oropharynx pink & dry mucous membranes. Neck:  Supple; no masses or thyromegaly. Lungs:  Respirations even and unlabored.  Clear throughout to auscultation.   No wheezes, crackles, or rhonchi. No acute distress. Heart:  Regular rate and rhythm; no murmurs, clicks, rubs, or gallops. Abdomen:  Normal bowel sounds. Soft, non-tender and diffusely distended, moderate, tympanic to percussion in the central abdomen, without masses, hepatosplenomegaly or hernias noted.  No guarding or rebound tenderness.   Rectal: Not performed Msk:  Symmetrical without gross deformities. Good, equal movement & strength bilaterally. Pulses:  Normal pulses noted. Extremities:  No clubbing, 3+edema.  No cyanosis. Neurologic:  Alert and oriented x1;  grossly normal neurologically. Skin: dry, scaly, Intact without significant lesions or rashes. No jaundice. Psych:  Alert and cooperative. Normal mood and affect.  Imaging Studies: reviewed  Assessment and Plan:   John Werner is a 84 y.o. Caucasian male with severe vasculopathy, diabetes, on insulin, history of hypertension, hyperlipidemia, carotid artery stenosis, chronic thrombocytopenia seen in  consultation for 2 months history of progressively worsening abdominal distention and swelling of legs. Cross-sectional imaging revealed cirrhosis of liver with moderate volume ascites. He has biochemical, imaging, clinical evidence of decompensated cirrhosis. Probably NASH etiology. Secondary liver disease workup came back unremarkable  Decompensated cirrhosis:secondary to NASH secondary liver disease workup negative except for slightly positive anti-double stranded DNA Status post therapeutic Paracentesis on 10/01/17, 4.2 L of clear yellow fluid was removed, fluid analysis consistent with portal hypertension, cytology was negative for malignancy Still hypervolemic with bilateral swelling of legs, continue furosemide 40 mg daily and spironolactone 100 mg daily Recommend 2 g sodium diet, material provided Check BMP and magnesium today and adjust the dose of diuretics  Varices: unknown, discussed with his daughter about upper endoscopy and its long-term implications. She prefers to discuss with the rest of her family members before undergoing [place] screening: CT A/P in 09/2017 did not reveal lesions. AFP is mildly elevated. I recommend MRI liver mass protocol. However, patient's daughter would like to discuss with rest of the family members before undergoing imaging studies. She is concerned that with his age and medical condition, patient may not be a candidate for chemotherapy or surgery if malignancy is detected Chronic constipation and probable hepatic encephalopathy: stop lactulose and switch to MiraLAX Discussed in length with her about the diagnosis of cirrhosis, its long-term implications, complications and overall prognosis. I strongly recommend to seek palliative care and patient's daughter is agreeable. We'll place a referral  Follow up in one to 2 months  Cephas Darby, MD

## 2017-10-26 ENCOUNTER — Other Ambulatory Visit: Payer: Self-pay | Admitting: Gastroenterology

## 2017-10-26 DIAGNOSIS — E1169 Type 2 diabetes mellitus with other specified complication: Secondary | ICD-10-CM

## 2017-10-26 DIAGNOSIS — K7290 Hepatic failure, unspecified without coma: Secondary | ICD-10-CM

## 2017-10-26 DIAGNOSIS — Z794 Long term (current) use of insulin: Secondary | ICD-10-CM

## 2017-11-02 LAB — FUNGAL ORGANISM REFLEX

## 2017-11-02 LAB — FUNGUS CULTURE WITH STAIN

## 2017-11-02 LAB — FUNGUS CULTURE RESULT

## 2017-11-08 ENCOUNTER — Other Ambulatory Visit
Admission: RE | Admit: 2017-11-08 | Discharge: 2017-11-08 | Disposition: A | Discharge status: Home / Self Care | Payer: Medicare HMO | Source: Ambulatory Visit | Attending: Gastroenterology | Admitting: Gastroenterology

## 2017-11-08 ENCOUNTER — Other Ambulatory Visit: Payer: Self-pay

## 2017-11-08 DIAGNOSIS — K7290 Hepatic failure, unspecified without coma: Secondary | ICD-10-CM

## 2017-11-08 DIAGNOSIS — K7460 Unspecified cirrhosis of liver: Secondary | ICD-10-CM

## 2017-11-08 LAB — BASIC METABOLIC PANEL
Anion gap: 8 (ref 5–15)
BUN: 26 mg/dL — ABNORMAL HIGH (ref 8–23)
CO2: 28 mmol/L (ref 22–32)
Calcium: 9 mg/dL (ref 8.9–10.3)
Chloride: 95 mmol/L — ABNORMAL LOW (ref 98–111)
Creatinine, Ser: 1.22 mg/dL (ref 0.61–1.24)
GFR calc Af Amer: 57 mL/min — ABNORMAL LOW (ref >60)
GFR calc non Af Amer: 49 mL/min — ABNORMAL LOW (ref >60)
Glucose, Bld: 189 mg/dL — ABNORMAL HIGH (ref 70–99)
POTASSIUM: 4.6 mmol/L (ref 3.5–5.1)
Sodium: 131 mmol/L — ABNORMAL LOW (ref 135–145)

## 2017-11-08 LAB — MAGNESIUM: MAGNESIUM: 2.1 mg/dL (ref 1.7–2.4)

## 2017-11-09 ENCOUNTER — Other Ambulatory Visit: Payer: Self-pay

## 2017-11-09 DIAGNOSIS — E871 Hypo-osmolality and hyponatremia: Secondary | ICD-10-CM

## 2017-11-17 LAB — ACID FAST CULTURE WITH REFLEXED SENSITIVITIES (MYCOBACTERIA): Acid Fast Culture: NEGATIVE

## 2017-11-17 LAB — ACID FAST CULTURE WITH REFLEXED SENSITIVITIES

## 2017-11-19 ENCOUNTER — Other Ambulatory Visit: Payer: Self-pay | Admitting: Gastroenterology

## 2017-11-19 DIAGNOSIS — K7469 Other cirrhosis of liver: Secondary | ICD-10-CM

## 2017-11-27 ENCOUNTER — Other Ambulatory Visit
Admission: RE | Admit: 2017-11-27 | Discharge: 2017-11-27 | Disposition: A | Discharge status: Home / Self Care | Payer: Medicare HMO | Source: Ambulatory Visit | Attending: Gastroenterology | Admitting: Gastroenterology

## 2017-11-27 DIAGNOSIS — K7469 Other cirrhosis of liver: Secondary | ICD-10-CM | POA: Insufficient documentation

## 2017-11-27 LAB — BASIC METABOLIC PANEL
ANION GAP: 11 (ref 5–15)
BUN: 28 mg/dL — AB (ref 8–23)
CHLORIDE: 93 mmol/L — AB (ref 98–111)
CO2: 25 mmol/L (ref 22–32)
Calcium: 8.8 mg/dL — ABNORMAL LOW (ref 8.9–10.3)
Creatinine, Ser: 1.15 mg/dL (ref 0.61–1.24)
GFR, EST NON AFRICAN AMERICAN: 53 mL/min — AB (ref >60)
Glucose, Bld: 217 mg/dL — ABNORMAL HIGH (ref 70–99)
POTASSIUM: 4.8 mmol/L (ref 3.5–5.1)
Sodium: 129 mmol/L — ABNORMAL LOW (ref 135–145)

## 2017-11-30 ENCOUNTER — Other Ambulatory Visit: Payer: Self-pay

## 2017-11-30 ENCOUNTER — Encounter (INDEPENDENT_AMBULATORY_CARE_PROVIDER_SITE_OTHER): Payer: Self-pay

## 2017-11-30 ENCOUNTER — Encounter: Payer: Self-pay | Admitting: Gastroenterology

## 2017-11-30 ENCOUNTER — Ambulatory Visit: Payer: Medicare HMO | Admitting: Gastroenterology

## 2017-11-30 VITALS — BP 121/80 | HR 78 | Resp 17 | Ht 65.0 in | Wt 121.6 lb

## 2017-11-30 DIAGNOSIS — K7460 Unspecified cirrhosis of liver: Secondary | ICD-10-CM

## 2017-11-30 DIAGNOSIS — R188 Other ascites: Principal | ICD-10-CM

## 2017-11-30 DIAGNOSIS — K7290 Hepatic failure, unspecified without coma: Secondary | ICD-10-CM

## 2017-11-30 DIAGNOSIS — K7682 Hepatic encephalopathy: Secondary | ICD-10-CM

## 2017-11-30 MED ORDER — RIFAXIMIN 550 MG PO TABS: 550.0000 mg | ORAL_TABLET | Freq: Three times a day (TID) | ORAL | 0 refills | Status: AC

## 2017-11-30 NOTE — Progress Notes (Signed)
John Darby, MD [address]  [place]  [place], [place] [address]  Main: [phone]  Fax: [phone]    Gastroenterology Consultation  Referring Provider:     Idelle Crouch, MD Primary Care Physician:  John Crouch, MD Primary Gastroenterologist:  Dr. Cephas Werner Reason for Consultation: Decompensated cirrhosis with ascites        HPI:   John Werner is a 84 y.o. Caucasian male referred by Dr. Doy Werner, John Douglas, MD  for consultation & management of decompensated cirrhosis with ascites. Patient has history of metabolic syndrome, severe vasculopathy, history of A. Fib, dementia who lives in a group home, Sr[facility] Living facility is accompanied by his daughter today who is the health care power of attorney. Most of the history is obtained from his daughter. Patient is noticed to have progressively worsening abdominal distention as well as bilateral swelling of legs for the last 2 months associated with decreased appetite, increasing lethargy. Underwent imaging studies by his PCP and was found to have cirrhosis with moderate volume ascites. Patient was started on furosemide 20 mg daily which he finished the course and according to his daughter, swelling did not improve. His daughter reports that this is a new diagnosis for him. She is questioning me about previous CT that was performed in 04/2017 and why the diagnosis of cirrhosis was not given at that time. The liver texture was described as heterogeneous but definite nodularity was not described back in January. Reviewing his records revealed that he has chronic thrombocytopenia dating back to 2010. He was previously seen at [facility]. His most recent labs from last month revealed normocytic anemia, normal LFTs, and normal platelets Patient was never a heavy drinker in his life Patient had a 2-D echo this week which revealed EF of 55%, no evidence of significant valvular abnormalities  Follow-up  visit 10/19/2017 Since last visit, I performed secondary liver disease workup which was unremarkable. He had therapeutic and diagnostic paracentesis performed, 4.5 L of clear yellow fluid was removed. Fluid analysis did not reveal evidence of SBP, SAAG >1.1 consistent with portal hypertension. Cytology was negative for malignancy. I started him on Lasix 40 mg and spironolactone 100 mg daily. He is accompanied by his daughter today who reports that patient continues to lose weight. His swelling of legs has improved but has not completely resolved. He is on lactulose twice daily and his abdomen is distended. His appetite fluctuates. His daughter reports that he is consuming much soft, eats more protein. She is unsure whether patient is constipated or not as he lives in a senior citizen assisted living facility.  Follow-up visit 11/30/2017 Since last visit, I reduced his dose of Lasix and spironolactone to half the previous Werner to worsening serum sodium levels and mildly elevated BUN/creatinine. Patient is accompanied by his daughter today for follow-up. He does have distention of his abdomen. She notices that he is more lethargic, spending most of the time in chair and head bent down, not talking much. Has appointment with palliative care next month. He is taking miraLAX, having one to 2 bowel movements daily  NSAIDs: none  Antiplts/Anticoagulants/Anti thrombotics: None  GI Procedures:colonoscopy several years ago  Past Medical History:  Diagnosis Date  . Atrial fibrillation ([place])   . AVD (aortic valve disease)   . Cancer ([place])    skin  . Carotid stenosis   . Dementia   . Diabetes mellitus without complication ([place])   . Glass  eye   . Heart disease   . History of colonic polyps   . Hyperlipidemia   . Hypertension   . Memory loss   . TIA (transient ischemic attack)     Past Surgical History:  Procedure Laterality Date  . CATARACT EXTRACTION Left     Current Outpatient Medications:  .   acetaminophen (TYLENOL) 325 MG tablet, Take 325 mg by mouth every 6 (six) hours as needed for mild pain or moderate pain., Disp: , Rfl:  .  acetaminophen (TYLENOL) 500 MG tablet, Take by mouth., Disp: , Rfl:  .  aspirin EC 81 MG tablet, Take 81 mg by mouth daily. , Disp: , Rfl:  .  BD INSULIN SYRINGE U/F 30G X 1/2" 0.5 ML MISC, daily., Disp: , Rfl: 3 .  Cyanocobalamin 5000 MCG SUBL, Place 5,000 mcg under the tongue daily., Disp: , Rfl:  .  diltiazem (CARDIZEM CD) 180 MG 24 hr capsule, Take 180 mg by mouth daily. , Disp: , Rfl:  .  furosemide (LASIX) 40 MG tablet, Take 1 tablet (40 mg total) by mouth daily with breakfast., Disp: 90 tablet, Rfl: 0 .  glucose blood (ONE TOUCH ULTRA TEST) test strip, USE 2 (TWO) TIMES DAILY. DIAGNOSIS: E11.65, Disp: , Rfl:  .  hydrochlorothiazide (HYDRODIURIL) 25 MG tablet, TAKE 1 TABLET EVERY MORNING, Disp: , Rfl:  .  insulin detemir (LEVEMIR) 100 UNIT/ML injection, Inject 20 Units into the skin daily., Disp: , Rfl:  .  megestrol (MEGACE) 40 MG/ML suspension, Take by mouth., Disp: , Rfl:  .  memantine (NAMENDA) 10 MG tablet, Take 10 mg by mouth at bedtime., Disp: , Rfl:  .  Multiple Vitamin (MULTI-VITAMINS) TABS, Take 1 tablet by mouth daily. , Disp: , Rfl:  .  neomycin-polymyxin b-dexamethasone (MAXITROL) 3.5-10000-0.1 OINT, Apply to eye., Disp: , Rfl:  .  nitrofurantoin (MACRODANTIN) 50 MG capsule, TAKE 1 CAPSULE BY MOUTH EVERY DAY IN THE EVENING, Disp: , Rfl: 3 .  pioglitazone (ACTOS) 30 MG tablet, Take 30 mg by mouth daily. , Disp: , Rfl:  .  Polyethyl Glycol-Propyl Glycol 0.4-0.3 % SOLN, Apply to eye., Disp: , Rfl:  .  sitaGLIPtin-metformin (JANUMET) 50-1000 MG tablet, Take 1 tablet by mouth 2 (two) times daily., Disp: , Rfl:  .  spironolactone (ALDACTONE) 100 MG tablet, Take 1 tablet (100 mg total) by mouth daily with breakfast., Disp: 90 tablet, Rfl: 0 .  fluorouracil (EFUDEX) 5 % cream, , Disp: , Rfl:  .  insulin aspart protamine - aspart (NOVOLOG 70/30 MIX)  (70-30) 100 UNIT/ML FlexPen, Inject under skin 36 units in the AM and 16 units in the PM, Disp: , Rfl:  .  lactulose (CHRONULAC) 10 GM/15ML solution, TAKE 30 MLS (20 G TOTAL) BY MOUTH 2 (TWO) TIMES DAILY. (Patient not taking: Reported on 11/30/2017), Disp: 240 mL, Rfl: 0 .  PSYLLIUM PO, Take by mouth., Disp: , Rfl:  .  sodium chloride (OCEAN) 0.65 % nasal spray, Place into the nose., Disp: , Rfl:   Current Facility-Administered Medications:  .  albumin human 25 % solution 25 g, 25 g, Intravenous, Once, John Werner, John Due, MD  No family history on file.   Social History   Tobacco Use  . Smoking status: Former Smoker    Types: Cigars  . Smokeless tobacco: Never Used  Substance Use Topics  . Alcohol use: No  . Drug use: No    Allergies as of 11/30/2017 - Review Complete 11/30/2017  Allergen Reaction Noted  . Zanaflex [  tizanidine hcl] Anaphylaxis 10/19/2017  . Captopril Other (See Comments) 05/05/2017  . Other Other (See Comments) 09/28/2017    Review of Systems:    All systems reviewed and negative except where noted in HPI.   Physical Exam:  BP 121/80 (BP Location: Left Arm, Patient Position: Sitting, Cuff Size: Normal)   Pulse 78   Resp 17   Ht 5\' 5"  (1.651 m)   Wt 121 lb 9.6 oz (55.2 kg)   BMI 20.24 kg/m  No LMP for male patient.  General:   Alert, thin built and cooperative in NAD, cachectic Head:  Normocephalic and atraumatic, bitemporal wasting. Eyes:  Sclera clear, no icterus.   Conjunctiva pink. Ears:  Normal auditory acuity. Nose:  No deformity, discharge, or lesions. Mouth:  No deformity or lesions,oropharynx pink & dry mucous membranes. Neck:  Supple; no masses or thyromegaly. Lungs:  Respirations even and unlabored.  Clear throughout to auscultation.   No wheezes, crackles, or rhonchi. No acute distress. Heart:  Regular rate and rhythm; no murmurs, clicks, rubs, or gallops. Abdomen:  Normal bowel sounds. Soft, non-tender and diffusely distended, moderate,  dull to percussion in the central abdomen, without masses, hepatosplenomegaly or hernias noted.  No guarding or rebound tenderness.   Rectal: Not performed Msk:  Symmetrical without gross deformities. Good, equal movement & strength bilaterally. Pulses:  Normal pulses noted. Extremities:  No clubbing, 1+edema in legs and 3+ edema in both feet.  No cyanosis. Neurologic:  Alert and oriented x1;  grossly normal neurologically. Skin: dry, scaly, ecchymosis in upper extremities Intact without significant lesions or rashes. No jaundice. Psych:  Alert and cooperative. Normal mood and affect.  Imaging Studies: reviewed  Assessment and Plan:   Alfonza Toft is a 84 y.o. Caucasian male with severe vasculopathy, diabetes, on insulin, history of hypertension, hyperlipidemia, carotid artery stenosis, chronic thrombocytopenia seen in consultation for 2 months history of progressively worsening abdominal distention and swelling of legs. Cross-sectional imaging revealed cirrhosis of liver with moderate volume ascites. He has biochemical, imaging, clinical evidence of decompensated cirrhosis. Probably NASH etiology. Secondary liver disease workup came back unremarkable  Decompensated cirrhosis:secondary to NASH secondary liver disease workup negative except for slightly positive anti-double stranded DNA Status post therapeutic Paracentesis on 10/01/17, 4.2 L of clear yellow fluid was removed, fluid analysis consistent with portal hypertension, cytology was negative for malignancy Still hypervolemic with bilateral swelling of legs, patient is unable to tolerate even low-dose Loop diuretics Werner to decreasing serum sodium levels, and AKI. Recommend to discontinue furosemide and spironolactone. Paracentesis as needed continue 2 g sodium diet, material provided Check BMP in next 2-4 weeks to check his sodium levels off diuretics Varices: unknown, discussed with his daughter about upper endoscopy and its long-term  implications. She prefers to discuss with the rest of her family members before undergoing [place] screening: CT A/P in 09/2017 did not reveal lesions. AFP is mildly elevated. I recommend MRI liver mass protocol. However, patient's daughter would like to discuss with rest of the family members before undergoing imaging studies. She is concerned that with his age and medical condition, patient may not be a candidate for chemotherapy or surgery if malignancy is detected probable hepatic encephalopathy:  continue MiraLAX Start rifaximin 550 mg 3 times daily  Discussed in length with her about the diagnosis of cirrhosis, its long-term implications, complications and overall prognosis. I strongly recommend to seek palliative care and patient's daughter is agreeable. Has appointment in next 2-3 weeks  Follow up in 3 months  John Darby, MD

## 2017-12-04 ENCOUNTER — Ambulatory Visit
Admission: RE | Admit: 2017-12-04 | Discharge: 2017-12-04 | Disposition: A | Discharge status: Home / Self Care | Payer: Medicare HMO | Source: Ambulatory Visit | Attending: Gastroenterology | Admitting: Gastroenterology

## 2017-12-04 ENCOUNTER — Ambulatory Visit: Admission: RE | Admit: 2017-12-04 | Payer: Medicare HMO | Source: Ambulatory Visit

## 2017-12-04 DIAGNOSIS — R188 Other ascites: Secondary | ICD-10-CM | POA: Insufficient documentation

## 2017-12-04 DIAGNOSIS — K7460 Unspecified cirrhosis of liver: Secondary | ICD-10-CM | POA: Insufficient documentation

## 2017-12-04 NOTE — Procedures (Signed)
Ultrasound-guided therapeutic paracentesis performed yielding 4 liters (maximum ordered) of yellow fluid. No immediate complications.     

## 2017-12-21 ENCOUNTER — Other Ambulatory Visit: Payer: Self-pay | Admitting: Gastroenterology

## 2017-12-21 DIAGNOSIS — Z794 Long term (current) use of insulin: Secondary | ICD-10-CM

## 2017-12-21 DIAGNOSIS — K7290 Hepatic failure, unspecified without coma: Secondary | ICD-10-CM

## 2017-12-21 DIAGNOSIS — E1169 Type 2 diabetes mellitus with other specified complication: Secondary | ICD-10-CM

## 2017-12-21 DIAGNOSIS — K7460 Unspecified cirrhosis of liver: Secondary | ICD-10-CM

## 2018-02-05 ENCOUNTER — Ambulatory Visit (INDEPENDENT_AMBULATORY_CARE_PROVIDER_SITE_OTHER): Payer: Medicare HMO | Admitting: Vascular Surgery

## 2018-02-05 ENCOUNTER — Encounter (INDEPENDENT_AMBULATORY_CARE_PROVIDER_SITE_OTHER): Payer: Medicare HMO

## 2018-02-05 ENCOUNTER — Ambulatory Visit (INDEPENDENT_AMBULATORY_CARE_PROVIDER_SITE_OTHER): Payer: Self-pay | Admitting: Vascular Surgery

## 2018-02-05 ENCOUNTER — Encounter (INDEPENDENT_AMBULATORY_CARE_PROVIDER_SITE_OTHER): Payer: Self-pay

## 2018-02-08 DEATH — deceased

## 2018-02-26 ENCOUNTER — Ambulatory Visit: Payer: Medicare HMO | Admitting: Gastroenterology

## 2018-02-26 ENCOUNTER — Encounter: Payer: Self-pay | Admitting: *Deleted

## 2020-04-20 IMAGING — CR DG CHEST 2V
2 series · 3 of 3 positions shown · non-contrast
Comparison: 07/27/2017

CLINICAL DATA: Chest pain.

EXAM:
CHEST - 2 VIEW

[Series 1: chest pa · 0.14mm/px · 2 of 2 slices shown]
[im 1/2]
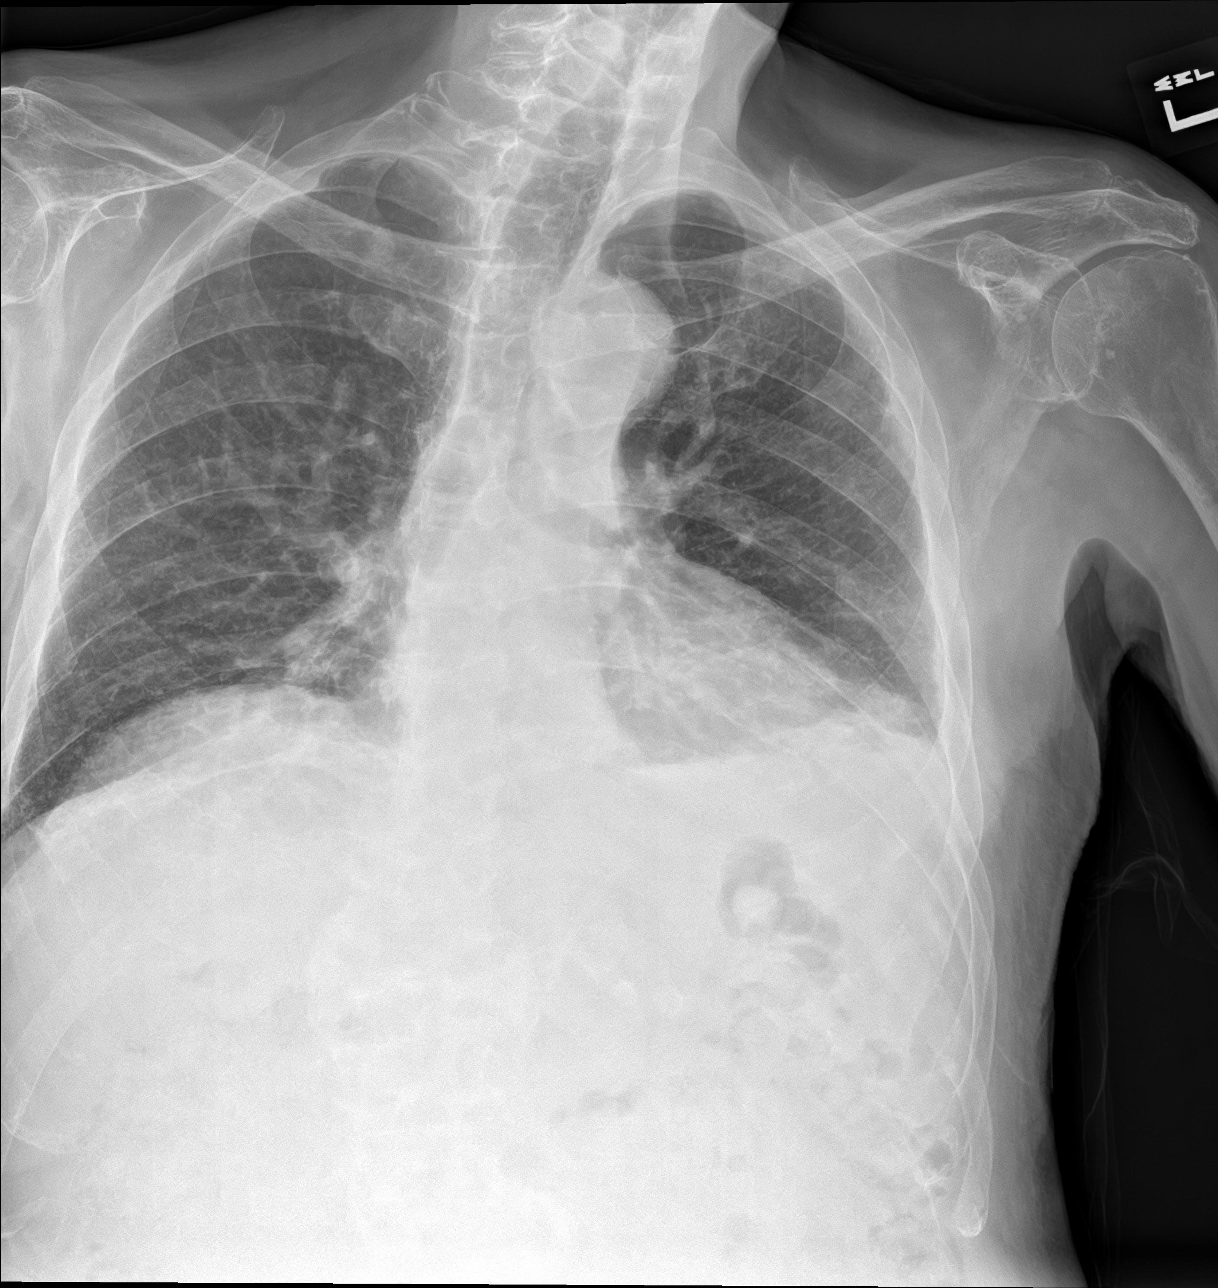
[im 2/2]
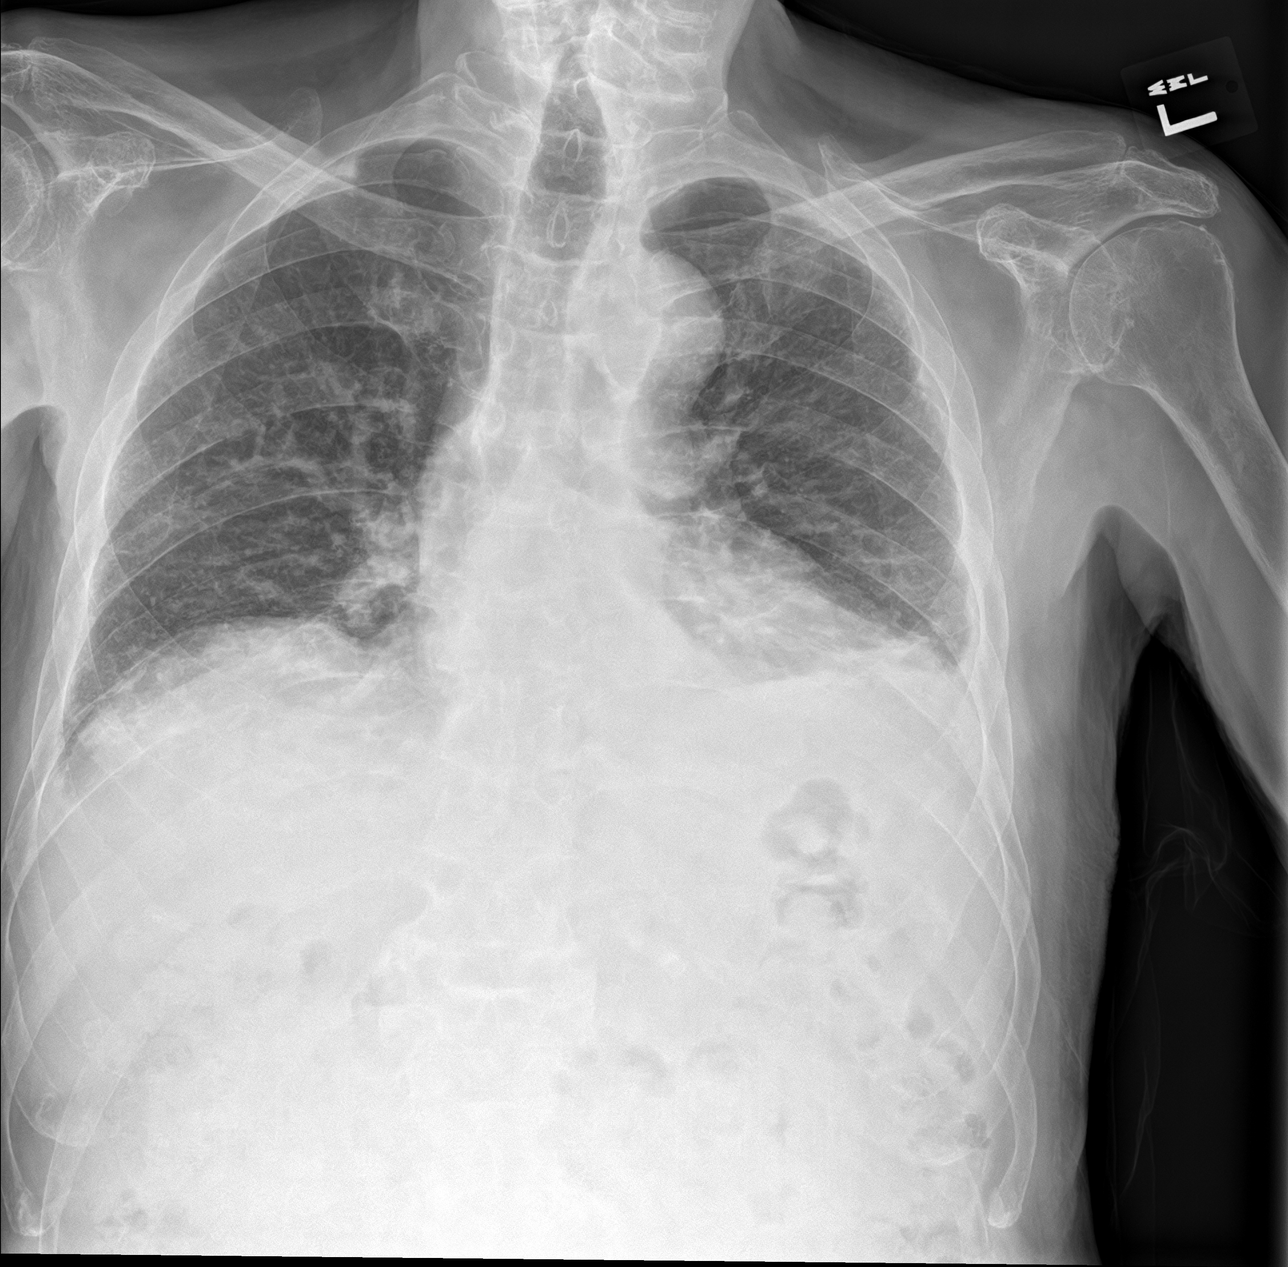

[chest lat]
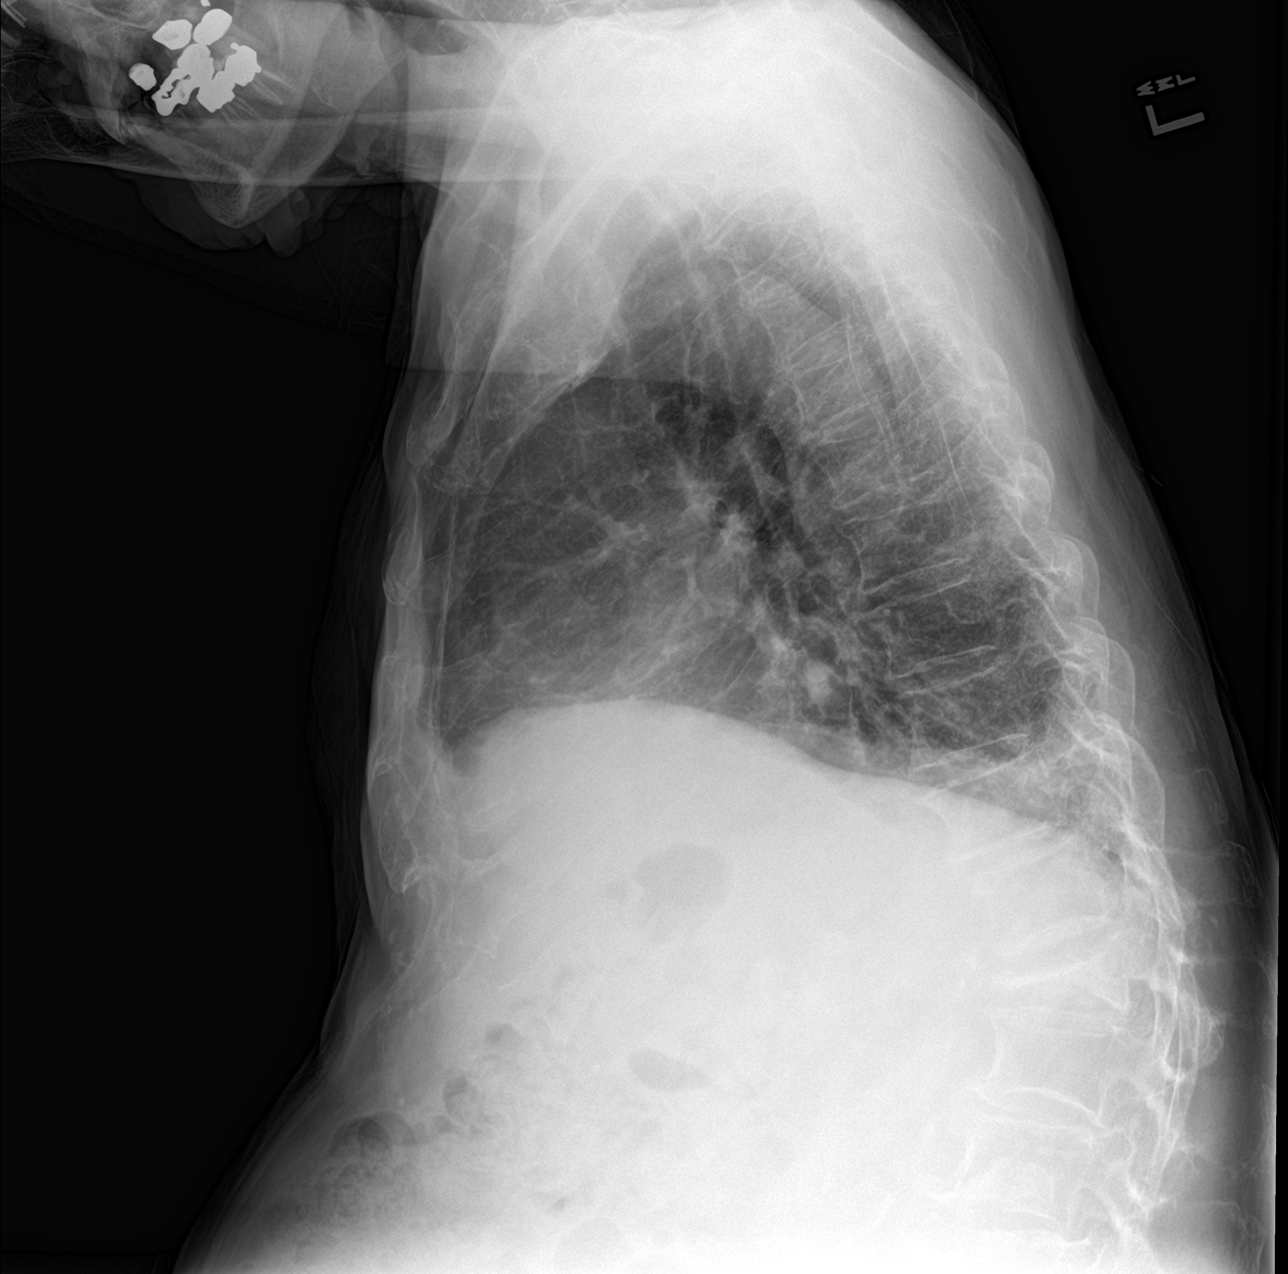

[3 of 3 positions shown; findings below may reference images not displayed]

FINDINGS: Shallow inspiration with linear atelectasis in the lung bases.
Probable small pleural effusions. Heart size and pulmonary
vascularity are normal. No pneumothorax. Mediastinal contours appear
intact. Degenerative changes in the spine and shoulders.
IMPRESSION: Shallow inspiration with linear atelectasis in the lung bases.
Probable small pleural effusions.

## 2020-06-07 IMAGING — CT CT CHEST W/ CM
2 of 5 series · 12 of 36 positions shown, 15 images · IV contrast (iopamidol)
Comparison: Chest radiograph August 01, 2017 and CT chest Prel[place]

CLINICAL DATA: Abdominal distension for 2 weeks. Early satiety.
History of on melanoma, dementia.

EXAM:
CT CHEST, ABDOMEN, AND PELVIS WITH CONTRAST
TECHNIQUE: Multidetector CT imaging of the chest, abdomen and pelvis was
performed following the standard protocol during bolus
administration of intravenous contrast.
CONTRAST:  85mL [id number] IOPAMIDOL ([id number]) INJECTION 61%

[Series 2: axials cap · axial · 0.72mm/px · z∈[-1574,-1044]mm · 9 of 130 slices shown, 12 images]
[im 12/130  mediastinal]
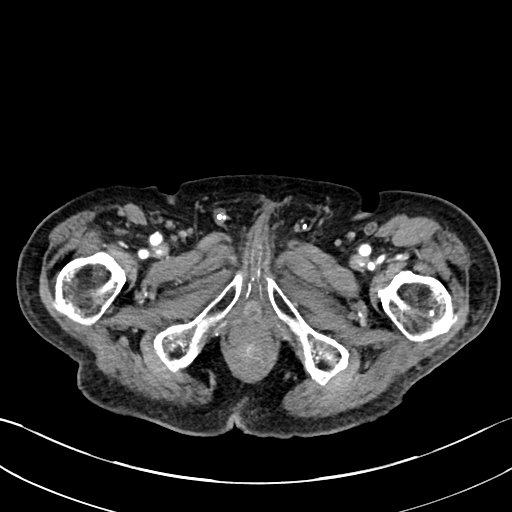
[im 12/130  lung]
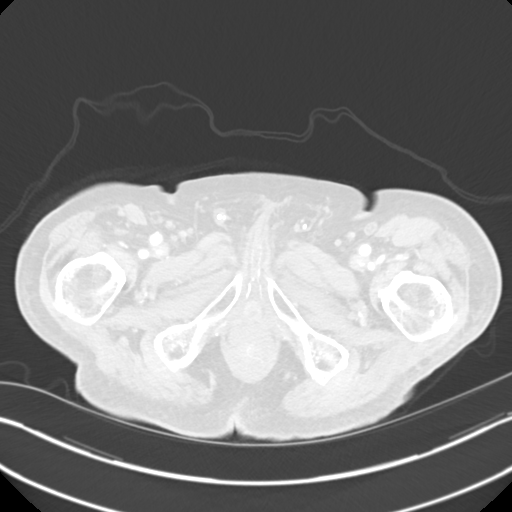
[im 24/130  lung]
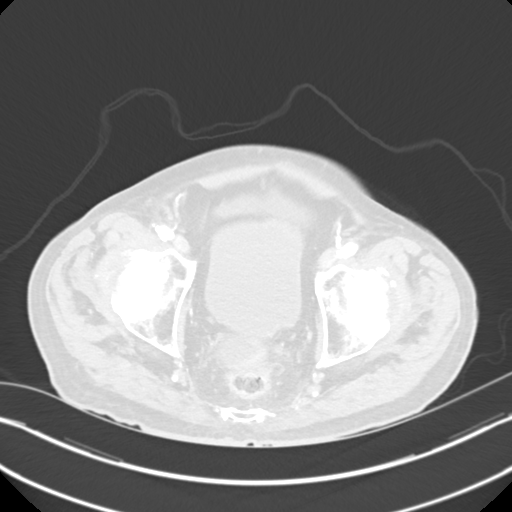
[im 36/130  lung]
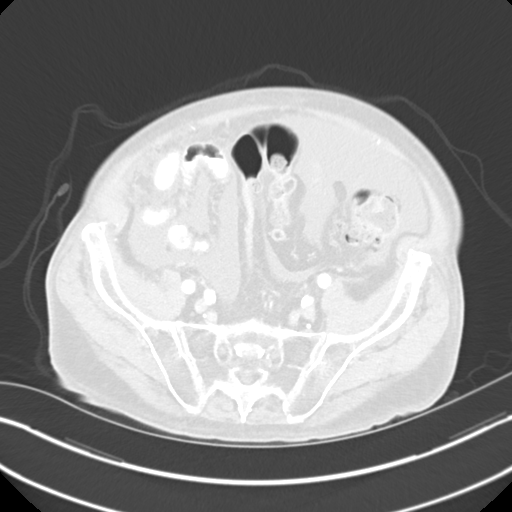
[im 47/130  lung]
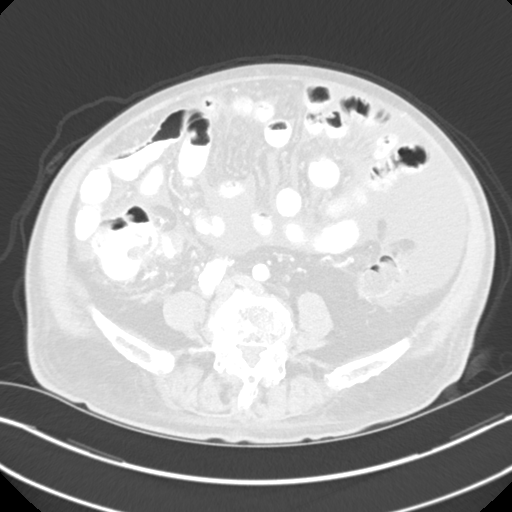
[im 71/130  mediastinal]
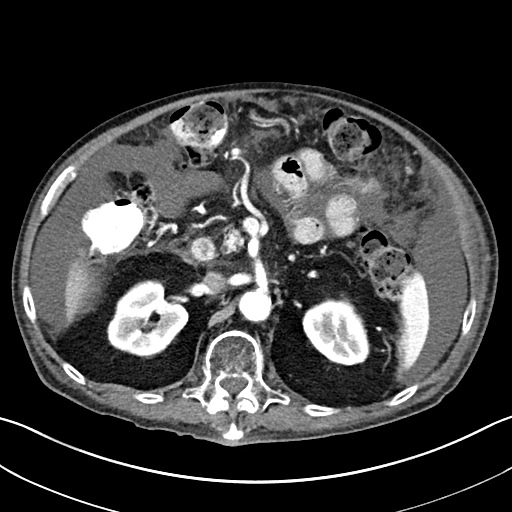
[im 71/130  lung]
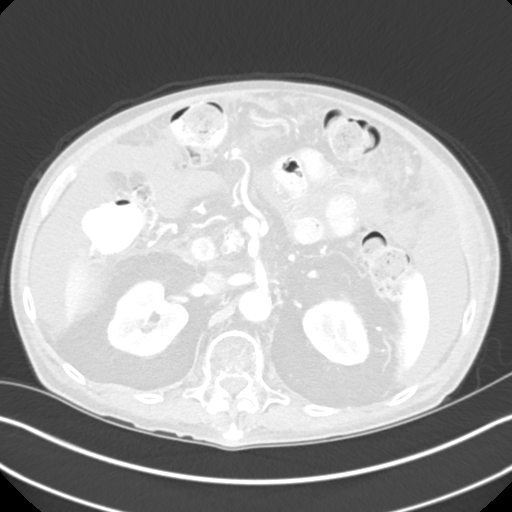
[im 83/130  lung]
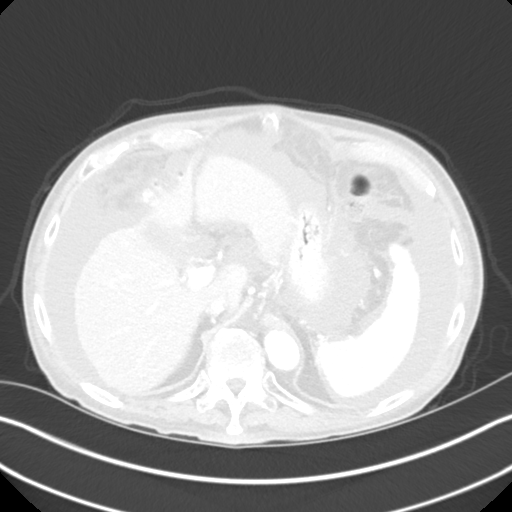
[im 94/130  lung]
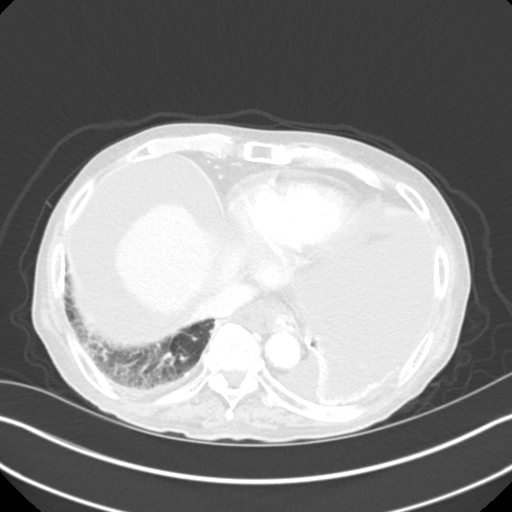
[im 106/130  lung]
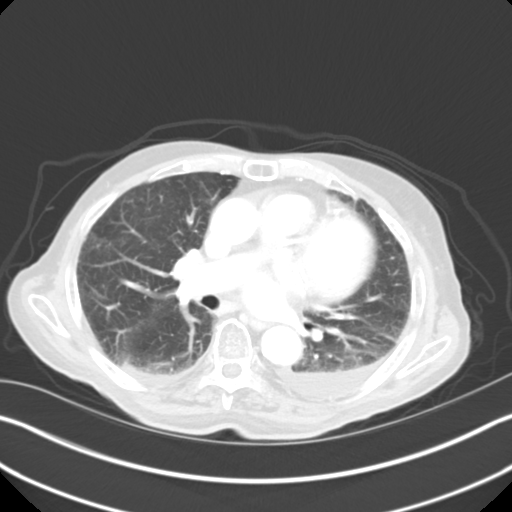
[im 118/130  mediastinal]
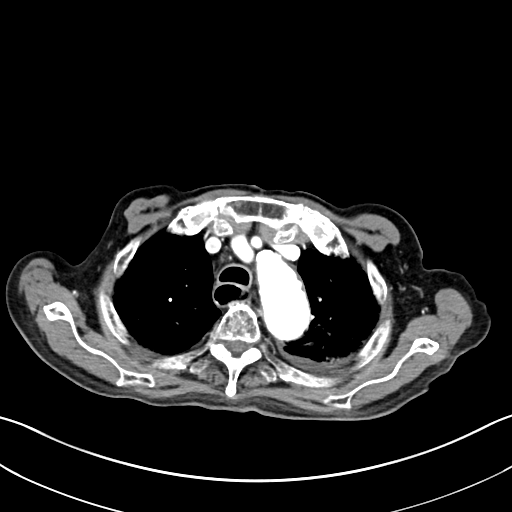
[im 118/130  lung]
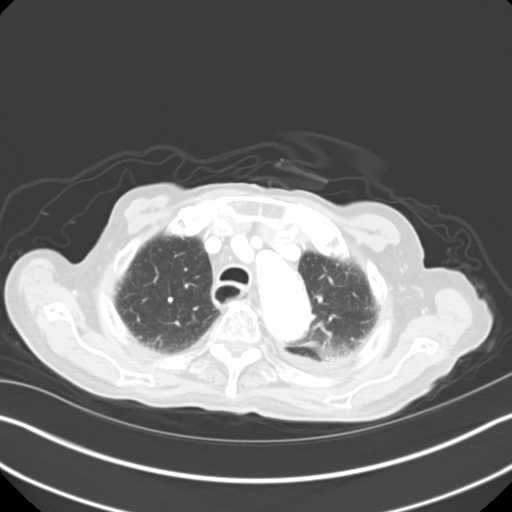

[Series 4: coronals cap · coronal · 0.71mm/px · 3 of 140 slices shown]
[im 28/140  lung]
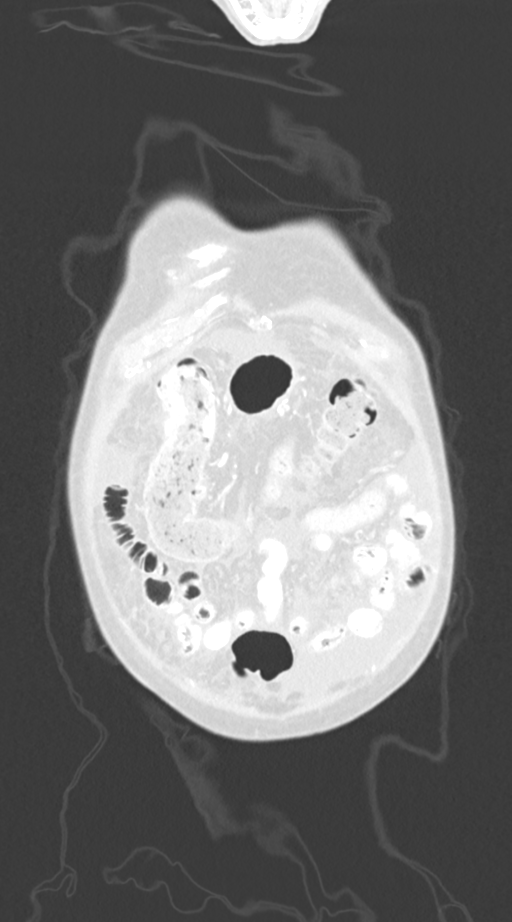
[im 56/140  lung]
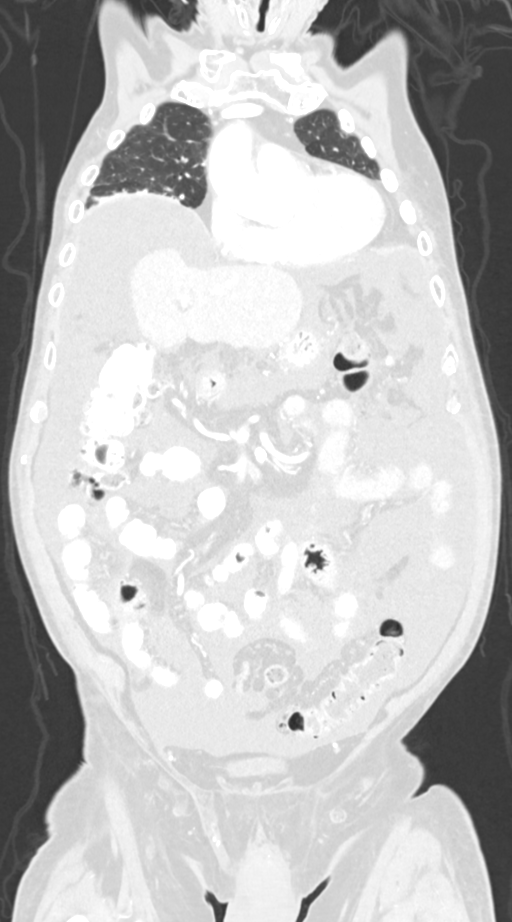
[im 84/140  lung]
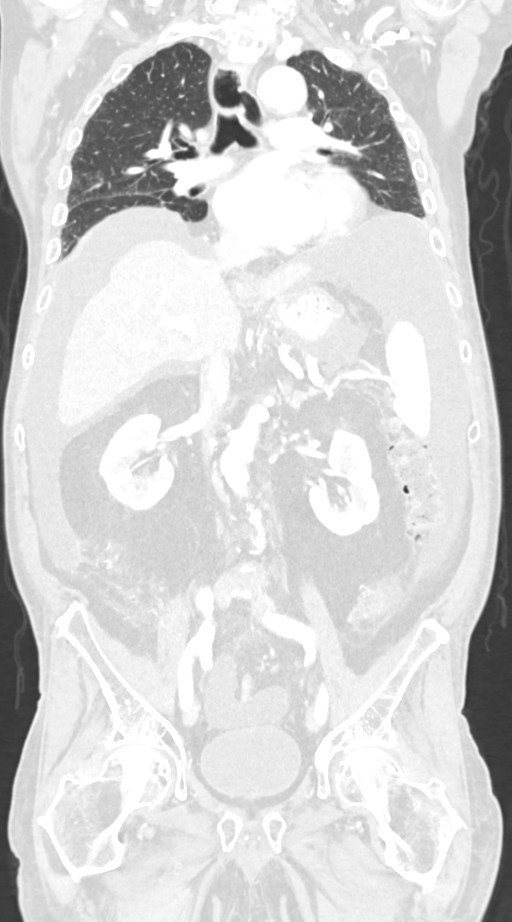

[12 of 36 positions shown; findings below may reference images not displayed]

FINDINGS: CT CHEST FINDINGS

CARDIOVASCULAR: Unchanged 3.5 cm aortic arch to proximal descending
aorta with mild ectasia. Mild calcific atherosclerosis. The heart is
mildly enlarged. Mild coronary artery calcifications. Trace
pericardial effusion.

MEDIASTINUM/NODES: No mediastinal mass. No lymphadenopathy by CT
size criteria. Mildly patulous esophagus with air-fluid level.

LUNGS/PLEURA: Tracheobronchial tree is patent, no pneumothorax.
Small LEFT and trace RIGHT pleural effusions. Punctate pleural
calcifications in lung bases, unchanged. No focal consolidation or
suspicious nodules.

MUSCULOSKELETAL: Osteopenia. Subacute nondisplaced LEFT
anterolateral fourth through sixth rib fractures.

CT ABDOMEN AND PELVIS FINDINGS

HEPATOBILIARY: Enlarged LEFT lobe of the liver with nodular pattern
contour. No hepatic mass or biliary dilatation. Patent main portal
vein. Normal gallbladder.

PANCREAS: Nonacute.  Atrophic.

SPLEEN: Normal.

ADRENALS/URINARY TRACT: Kidneys are orthotopic, demonstrating
symmetric enhancement. Bilateral multifocal renal scarring. No
nephrolithiasis, hydronephrosis or solid renal masses. The
unopacified ureters are normal in course and caliber. Delayed
imaging through the kidneys demonstrates symmetric prompt contrast
excretion within the proximal urinary collecting system. Urinary
bladder is partially distended and unremarkable. Stable nodular
thickening adrenal gland seen with hyperplasia.

STOMACH/BOWEL: The stomach, small and large bowel are normal in
course and caliber without inflammatory changes. Severe descending
and sigmoid colonic diverticulosis. Moderate amount of retained
large bowel stool. Status post appendectomy. Similar mildly
thickened rectum compatible with internal hemorrhoids.

VASCULAR/LYMPHATIC: Aortoiliac vessels are normal in course and
caliber. Moderate calcific atherosclerosis. No lymphadenopathy by CT
size criteria.

REPRODUCTIVE: Heterogeneously dense prostate, borderline
enlargement.

OTHER: Moderate volume ascites. Small volume fluid tracking along
the crus of the diaphragm into posterior mediastinum. Mesenteric
edema. No intraperitoneal free air.

MUSCULOSKELETAL: Non-acute. Progress T12 compression fracture with
vertebra plana. Slightly worsening height loss old moderate to
severe L3 compression fracture. Osteopenia. Tiny fat containing
inguinal hernias. Focal sclerosis bilateral femoral heads with
slight collapse on a RIGHT most compatible with secondary ATN. Trace
LEFT hip effusion.
IMPRESSION: CT CHEST:

1. Small LEFT and trace RIGHT pleural effusions.  Mild cardiomegaly.
2. Subacute LEFT fourth through sixth rib fractures.
3. Patulous esophagus with air-fluid level, aspiration risk.

CT ABDOMEN AND PELVIS:

1. Cirrhosis. Moderate volume ascites.
2. Moderate volume retained large bowel stool without bowel
obstruction. Moderate to severe colonic diverticulosis.
3. Progressed T12 vertebra plana and moderate to severe L3
compression fracture.

These results will be called to the ordering clinician or
representative by the Radiologist Assistant, and communication
documented in the PACS or zVision Dashboard.

Aortic Atherosclerosis ([id number]-G54.4).

## 2020-06-20 IMAGING — US US PARACENTESIS
1 series · 4 of 4 positions shown · non-contrast
Comparison: none

INDICATION: Ascites, abdominal distension

[Series 1: us paracentesis · 4 of 4 slices shown]
[im 1/4]
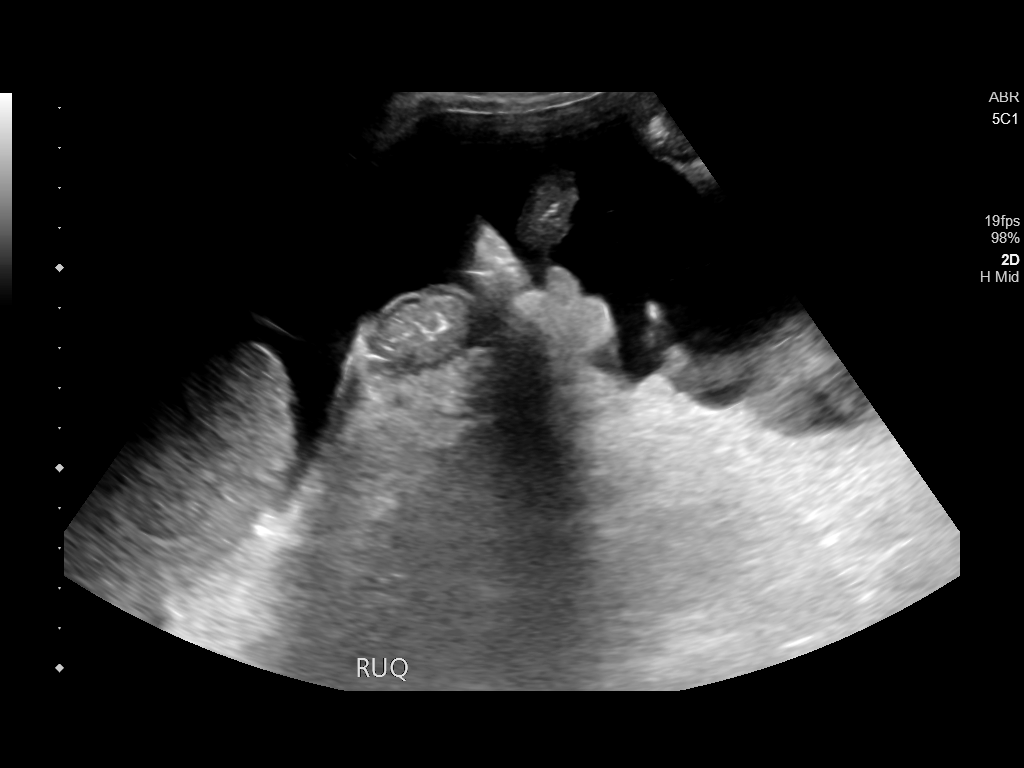
[im 2/4]
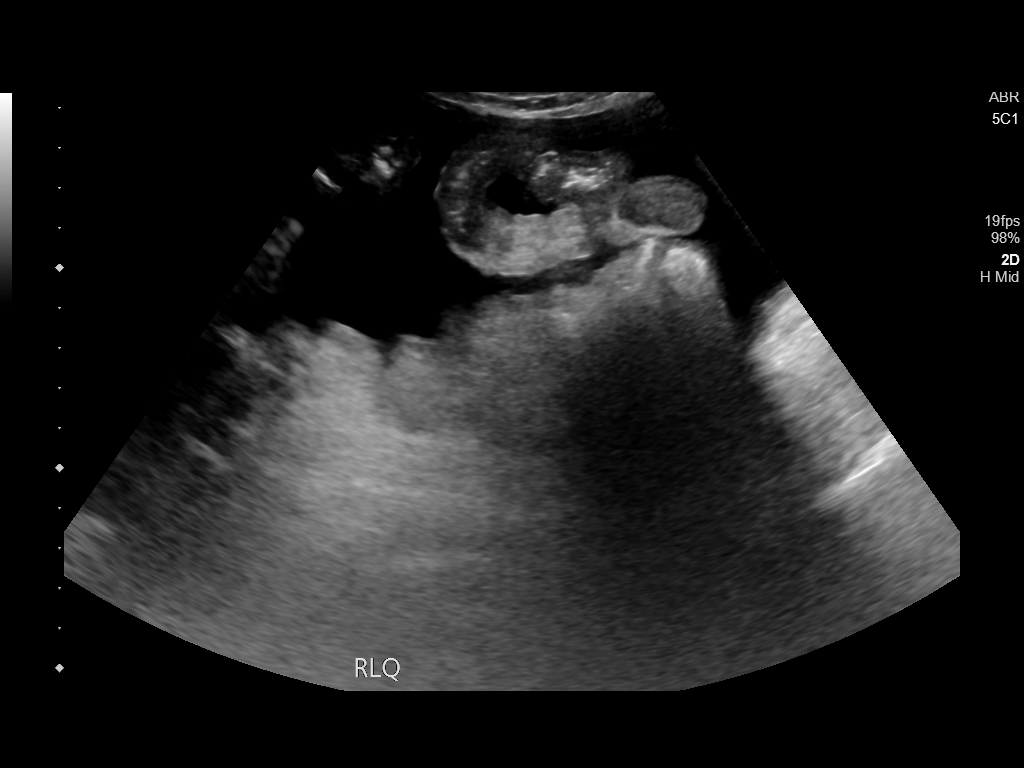
[im 3/4]
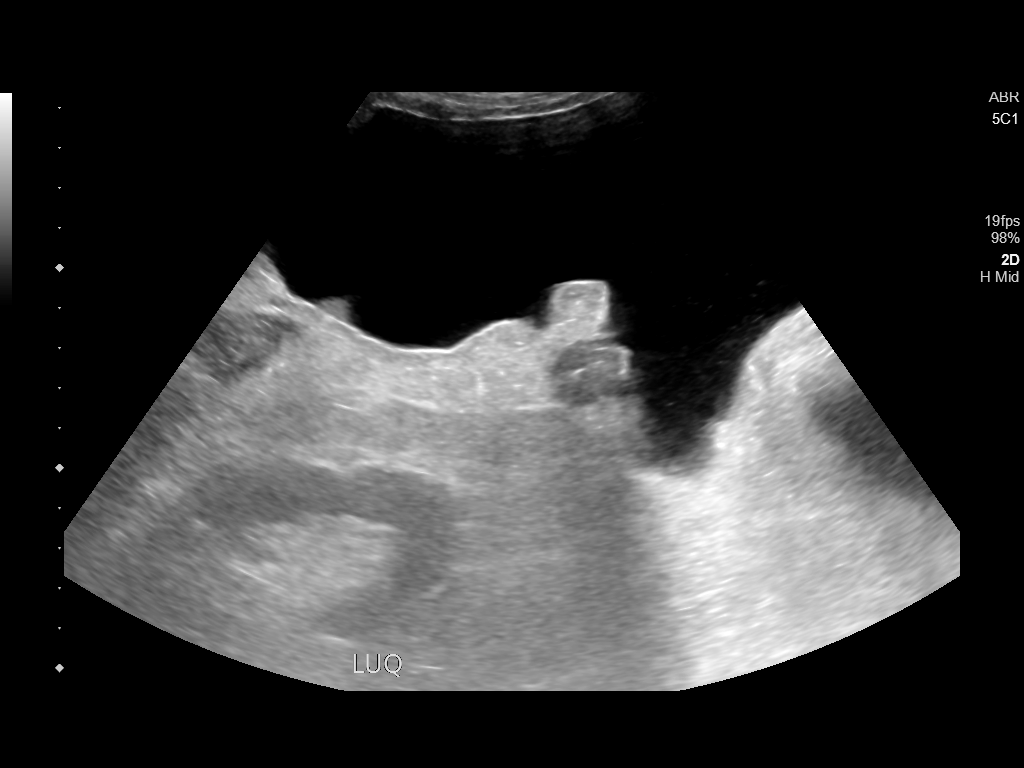
[im 4/4]
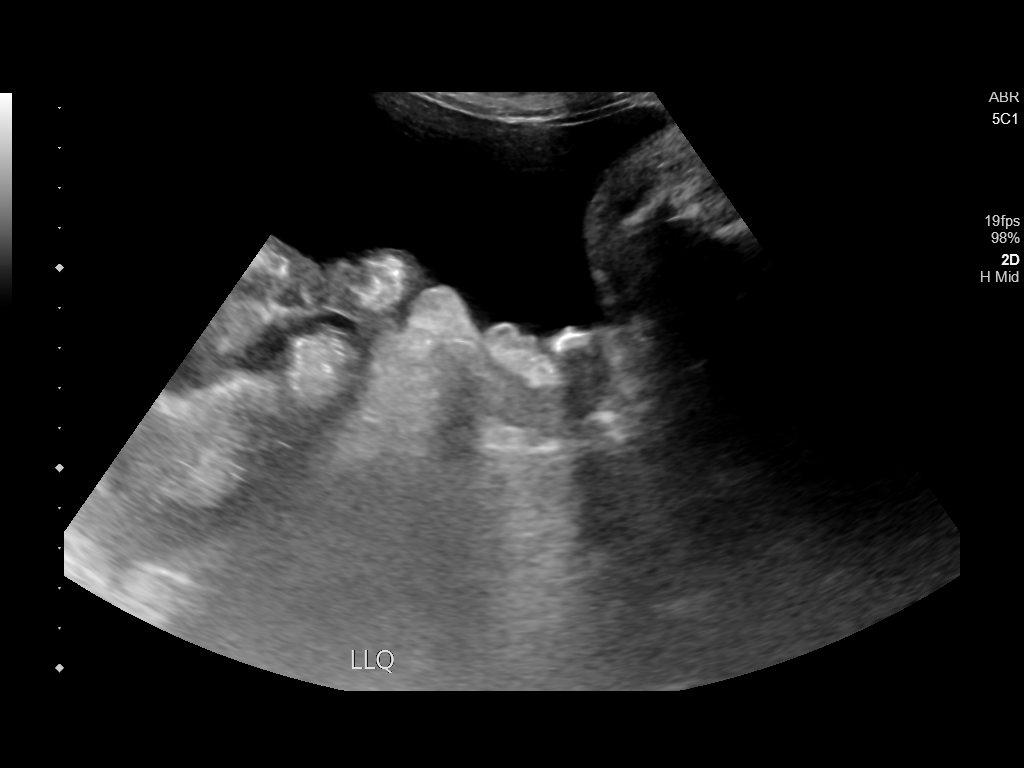

[4 of 4 positions shown; findings below may reference images not displayed]

EXAM:
ULTRASOUND GUIDED RIGHT PARACENTESIS

MEDICATIONS:
1% LIDOCAINE LOCAL

COMPLICATIONS:
None immediate.

PROCEDURE:
Informed written consent was obtained from the patient after a
discussion of the risks, benefits and alternatives to treatment. A
timeout was performed prior to the initiation of the procedure.

Initial ultrasound scanning demonstrates a large amount of ascites
within the right lower abdominal quadrant. The right lower abdomen
was prepped and draped in the usual sterile fashion. 1% lidocaine
with epinephrine was used for local anesthesia.

Following this, a 6 Fr Safe-T-Centesis catheter was introduced. An
ultrasound image was saved for documentation purposes. The
paracentesis was performed. The catheter was removed and a dressing
was applied. The patient tolerated the procedure well without
immediate post procedural complication.
FINDINGS: A total of approximately 4.5 L of clear peritoneal fluid was
removed. Samples were sent to the laboratory as requested by the
clinical team.
IMPRESSION: Successful ultrasound-guided paracentesis yielding 4.5 liters of
peritoneal fluid.
